# Patient Record
Sex: Female | Born: 1960 | Hispanic: No | Marital: Married | Smoking: Current every day smoker
Health system: Southern US, Community
[De-identification: ages and names within clinical notes are randomized; demographics above are authoritative.]

## PROBLEM LIST (undated history)

## (undated) DIAGNOSIS — J432 Centrilobular emphysema: Secondary | ICD-10-CM

## (undated) HISTORY — PX: FINGER AMPUTATION: SHX636

## (undated) HISTORY — DX: Centrilobular emphysema: J43.2

---

## 2010-08-29 ENCOUNTER — Ambulatory Visit
Admission: RE | Admit: 2010-08-29 | Discharge: 2010-08-29 | Payer: Self-pay | Source: Home / Self Care | Attending: Family Medicine | Admitting: Family Medicine

## 2010-08-29 DIAGNOSIS — J0100 Acute maxillary sinusitis, unspecified: Secondary | ICD-10-CM | POA: Insufficient documentation

## 2010-08-29 DIAGNOSIS — R05 Cough: Secondary | ICD-10-CM | POA: Insufficient documentation

## 2010-09-20 NOTE — Assessment & Plan Note (Signed)
Summary: NOV sinusitis   Vital Signs:  Patient profile:   50 year old female Height:      66 inches Weight:      134 pounds BMI:     21.71 O2 Sat:      97 % on Room air Temp:     98.2 degrees F oral Pulse rate:   98 / minute BP sitting:   92 / 63  (left arm) Cuff size:   regular  Vitals Entered By: Payton Spark CMA (August 29, 2010 2:14 PM)  O2 Flow:  Room air CC: New to est. Sinus inection x 3 weeks.    Primary Care Provider:  Seymour Bars DO  CC:  New to est. Sinus inection x 3 weeks. Bianca Lin  History of Present Illness: 50 yo WF presents with a head cold that began the wk before Xmas.  She had chest tightness.  She had a little sore throat.  She had chest tightness and SOB.  Started to taking OTC cold meds.  She had bodyaches and headache.  She has had a productive cough.  She has a lot of rhinorrhea.  She is getting ear pressure but no HAs.     She is o/w healthy but smokes.   Current Medications (verified): 1)  Valtrex 1 Gm Tabs (Valacyclovir Hcl) .... Take 1/2 Tab By Mouth Once Daily 2)  Vitamin E 3)  Citrucel  Powd (Methylcellulose (Laxative))  Allergies (verified): 1)  ! Sulfa  Past History:  Past Medical History: smoker gyn:Dr Minerva Areola  Past Surgical History: TAH w/o oophorectomy for scar tissue 2009 ovarian cyst 1996  Family History: mother alive, high chol father deceased, 17 - lung cancer sister healthy  Social History: SAHM.  Married to [place]. Has a 39 yo son in [place] and a 24 yo daughter at home. Smokes 1/2 ppd x 30 yrs. 5 beers/ wk. 5 coffees/ day. Walks the dog 30 min/ day.  Review of Systems       no fevers/sweats/weakness, unexplained wt loss/gain, no change in vision, no difficulty hearing, ringing in ears, no hay fever/+allergies, no CP/discomfort, no palpitations, no breast lump/nipple discharge, + cough/wheeze, no blood in stool, no N/V/D, no nocturia, no leaking urine, no unusual vag bleeding, no vaginal/penile discharge, no  muscle/joint pain, no rash, no new/changing mole, no HA, no memory loss, no anxiety, no sleep problem, no depression, no unexplained lumps, no easy bruising/bleeding, no concern with sexual function   Physical Exam  General:  alert, well-developed, well-nourished, and well-hydrated.   Head:  normocephalic and atraumatic.  maxillary sinuses TTP Eyes:  conjunctiva clear; wears glasses Ears:  EACs patent; TMs translucent and gray with good cone of light and bony landmarks.  Nose:  boggy turbinates with nasal congestion Mouth:  pharynx pink and moist and fair dentition.  cobblestoning Neck:  no masses.   Lungs:  Normal respiratory effort, chest expands symmetrically. Lungs are clear to auscultation, no crackles or wheezes. Heart:  Normal rate and regular rhythm. S1 and S2 normal without gallop, murmur, click, rub or other extra sounds. Extremities:  no LE edema Skin:  color normal.   Cervical Nodes:  No lymphadenopathy noted   Impression & Recommendations:  Problem # 1:  ACUTE MAXILLARY SINUSITIS (ICD-461.0) Will treat given long duration with Amoxicillin x 10 days + supportive care measures.   Call if not improved in 10 days. Her updated medication list for this problem includes:    Amoxicillin 875 Mg Tabs (Amoxicillin) .Bianca KitchenMarland KitchenMarland KitchenMarland Lin 1  tab by mouth q 12 hrs x 10 days  Problem # 2:  COUGH (ICD-786.2) Cough secondary to postnasal drip.  Treat with OTC Mucinex DM, avoiding smoking and a ProAir inhaler.  She may have some COPD given her smoking hx. Plan to do PFTs this year.  Complete Medication List: 1)  Valtrex 1 Gm Tabs (Valacyclovir hcl) .... Take 1/2 tab by mouth once daily 2)  Vitamin E  3)  Citrucel Powd (Methylcellulose (laxative)) 4)  Amoxicillin 875 Mg Tabs (Amoxicillin) .Bianca Lin.. 1 tab by mouth q 12 hrs x 10 days 5)  Proair Hfa 108 (90 Base) Mcg/act Aers (Albuterol sulfate) .... 2 puffs 3-4 x a day as needed for wheezing  Patient Instructions: 1)  For sinusitis: 2)  Take 10 days of  Amoxicillin. 3)  Avoid smoking. 4)  Use OTC Mucinex DM for cough and congestion with daily zyrtec. 5)  Use ProAir 2 puffs 3-4 x a day for the next 10 days for wheezing and shortness of breathe then move to as needed. 6)  Call if not improved after 10 days. 7)  REturn for a PHYSICAL w/o pap in 2 mos. Prescriptions: AMOXICILLIN 875 MG TABS (AMOXICILLIN) 1 tab by mouth q 12 hrs x 10 days  #20 x 0   Entered and Authorized by:   Seymour Bars DO   Signed by:   Seymour Bars DO on 08/29/2010   Method used:   Electronically to        [id number]* (retail)       [address], [place]  [address]       Ph: [phone]       Fax: [phone]   RxID:   [id number]    Orders Added: 1)  New Patient Level III [84132]

## 2010-10-01 ENCOUNTER — Ambulatory Visit: Payer: Self-pay | Admitting: Family Medicine

## 2012-06-16 ENCOUNTER — Ambulatory Visit (INDEPENDENT_AMBULATORY_CARE_PROVIDER_SITE_OTHER): Admitting: Sports Medicine

## 2012-06-16 VITALS — BP 98/62 | HR 87

## 2012-06-16 DIAGNOSIS — Z2989 Encounter for other specified prophylactic measures: Secondary | ICD-10-CM

## 2012-06-16 DIAGNOSIS — Z298 Encounter for other specified prophylactic measures: Secondary | ICD-10-CM

## 2012-06-16 DIAGNOSIS — Z23 Encounter for immunization: Secondary | ICD-10-CM

## 2012-06-16 NOTE — Progress Notes (Signed)
I was present for all essential parts of this procedure. Ihor Austin. Benjamin Stain, M.D.

## 2013-05-19 ENCOUNTER — Ambulatory Visit: Payer: PRIVATE HEALTH INSURANCE

## 2013-05-20 ENCOUNTER — Ambulatory Visit: Payer: PRIVATE HEALTH INSURANCE | Admitting: *Deleted

## 2013-05-27 ENCOUNTER — Ambulatory Visit (INDEPENDENT_AMBULATORY_CARE_PROVIDER_SITE_OTHER): Admitting: Sports Medicine

## 2013-05-27 DIAGNOSIS — Z23 Encounter for immunization: Secondary | ICD-10-CM

## 2013-05-27 NOTE — Progress Notes (Signed)
  Subjective:    Patient ID: Bianca Lin, female    DOB: [birth year], 52 y.o.   MRN: [id number] Flu shot given.  Right deltoid.  No complications. Donne Anon, CMA HPI    Review of Systems     Objective:   Physical Exam        Assessment & Plan:

## 2014-01-04 ENCOUNTER — Ambulatory Visit (INDEPENDENT_AMBULATORY_CARE_PROVIDER_SITE_OTHER): Admitting: Sports Medicine

## 2014-01-04 DIAGNOSIS — Z23 Encounter for immunization: Secondary | ICD-10-CM

## 2014-01-04 MED ORDER — PNEUMOCOCCAL VAC POLYVALENT 25 MCG/0.5ML IJ INJ: 0.5000 mL | INJECTION | Freq: Once | INTRAMUSCULAR | Status: DC

## 2014-01-04 NOTE — Progress Notes (Signed)
Pneumococcal vaccine as above.

## 2014-01-17 LAB — HM MAMMOGRAPHY

## 2014-06-02 ENCOUNTER — Ambulatory Visit (INDEPENDENT_AMBULATORY_CARE_PROVIDER_SITE_OTHER): Admitting: Sports Medicine

## 2014-06-02 DIAGNOSIS — Z23 Encounter for immunization: Secondary | ICD-10-CM

## 2014-06-02 NOTE — Progress Notes (Signed)
   Subjective:    Patient ID: Bianca Lin, female    DOB: 28-Jan-1961, 53 y.o.   MRN: [id number]  HPI   Pt is here for flu injection  Review of Systems     Objective:   Physical Exam        Assessment & Plan:

## 2014-09-05 ENCOUNTER — Encounter: Payer: Self-pay | Admitting: Sports Medicine

## 2014-09-05 ENCOUNTER — Ambulatory Visit (INDEPENDENT_AMBULATORY_CARE_PROVIDER_SITE_OTHER)

## 2014-09-05 ENCOUNTER — Ambulatory Visit (INDEPENDENT_AMBULATORY_CARE_PROVIDER_SITE_OTHER): Admitting: Sports Medicine

## 2014-09-05 VITALS — BP 105/64 | HR 77 | Ht 66.0 in | Wt 130.0 lb

## 2014-09-05 DIAGNOSIS — M5412 Radiculopathy, cervical region: Secondary | ICD-10-CM

## 2014-09-05 DIAGNOSIS — M791 Myalgia: Secondary | ICD-10-CM

## 2014-09-05 DIAGNOSIS — M7062 Trochanteric bursitis, left hip: Secondary | ICD-10-CM | POA: Diagnosis not present

## 2014-09-05 DIAGNOSIS — M7918 Myalgia, other site: Secondary | ICD-10-CM | POA: Insufficient documentation

## 2014-09-05 DIAGNOSIS — M542 Cervicalgia: Secondary | ICD-10-CM | POA: Diagnosis not present

## 2014-09-05 MED ORDER — VENLAFAXINE HCL ER 37.5 MG PO CP24: ORAL_CAPSULE | ORAL | Status: DC

## 2014-09-05 MED ORDER — VENLAFAXINE HCL ER 75 MG PO CP24: 75.0000 mg | ORAL_CAPSULE | Freq: Every day | ORAL | Status: DC

## 2014-09-05 NOTE — Progress Notes (Signed)
  Subjective:    CC: hip and back pain  HPI: This is a pleasant 54 year old female who comes in complaining of pain all over, neck, back, hips, shoulders.  Neck pain: Specifically comes down the left upper shoulder and left periscapular region. No numbness or tingling to the fingertips.  Left hip pain: Localized over the greater trochanter, moderate, persistent.  Pain all over: She does endorse depressive symptoms, she lost her husband a year ago.  Does have some depressed mood. No suicidal or homicidal ideation  Past medical history, Surgical history, Family history not pertinant except as noted below, Social history, Allergies, and medications have been entered into the medical record, reviewed, and no changes needed.   Review of Systems: No fevers, chills, night sweats, weight loss, chest pain, or shortness of breath.   Objective:    General: Well Developed, well nourished, and in no acute distress.  Neuro: Alert and oriented x3, extra-ocular muscles intact, sensation grossly intact.  HEENT: Normocephalic, atraumatic, pupils equal round reactive to light, neck supple, no masses, no lymphadenopathy, thyroid nonpalpable.  Skin: Warm and dry, no rashes. Cardiac: Regular rate and rhythm, no murmurs rubs or gallops, no lower extremity edema.  Respiratory: Clear to auscultation bilaterally. Not using accessory muscles, speaking in full sentences. Neck: Negative spurling's Full neck range of motion Grip strength and sensation normal in bilateral hands Strength good C4 to T1 distribution No sensory change to C4 to T1 Reflexes normal Left Hip: ROM IR: 60 Deg, ER: 60 Deg, Flexion: 120 Deg, Extension: 100 Deg, Abduction: 45 Deg, Adduction: 45 Deg Strength IR: 5/5, ER: 5/5, Flexion: 5/5, Extension: 5/5, Abduction: 5/5, Adduction: 5/5 Pelvic alignment unremarkable to inspection and palpation. Standing hip rotation and gait without trendelenburg / unsteadiness. Greater trochanter with  significant tenderness to palpation. No tenderness over piriformis. No SI joint tenderness and normal minimal SI movement. Hip abductor significantly weak  Impression and Recommendations:

## 2014-09-05 NOTE — Assessment & Plan Note (Signed)
X-rays, formal physical therapy. Return in 4 weeks.

## 2014-09-05 NOTE — Assessment & Plan Note (Signed)
There is no element of myofascial pain syndrome, likely fibromyalgia with associated depressed mood with widespread myalgias. Starting Effexor. She did lose her husband last year.

## 2014-09-05 NOTE — Assessment & Plan Note (Signed)
Starting with formal PT. Return in one month, injection no better. I would also like to see her in the interim custom orthotics. She did start a new job at [employer], and is on her feet all day.

## 2014-09-09 ENCOUNTER — Encounter: Admitting: Sports Medicine

## 2014-09-19 ENCOUNTER — Encounter: Admitting: Sports Medicine

## 2014-09-20 ENCOUNTER — Ambulatory Visit (INDEPENDENT_AMBULATORY_CARE_PROVIDER_SITE_OTHER): Admitting: Physical Therapy

## 2014-09-20 DIAGNOSIS — R293 Abnormal posture: Secondary | ICD-10-CM

## 2014-09-20 DIAGNOSIS — M2560 Stiffness of unspecified joint, not elsewhere classified: Secondary | ICD-10-CM

## 2014-09-20 DIAGNOSIS — M2550 Pain in unspecified joint: Secondary | ICD-10-CM | POA: Diagnosis not present

## 2014-09-20 DIAGNOSIS — M5412 Radiculopathy, cervical region: Secondary | ICD-10-CM

## 2014-09-22 ENCOUNTER — Encounter: Payer: Self-pay | Admitting: Sports Medicine

## 2014-09-22 ENCOUNTER — Ambulatory Visit (INDEPENDENT_AMBULATORY_CARE_PROVIDER_SITE_OTHER): Admitting: Sports Medicine

## 2014-09-22 VITALS — BP 103/60 | HR 74 | Ht 66.0 in | Wt 130.0 lb

## 2014-09-22 DIAGNOSIS — M7062 Trochanteric bursitis, left hip: Secondary | ICD-10-CM

## 2014-09-22 NOTE — Assessment & Plan Note (Signed)
Custom orthotics as above. 

## 2014-09-22 NOTE — Progress Notes (Signed)

## 2014-09-27 ENCOUNTER — Encounter (INDEPENDENT_AMBULATORY_CARE_PROVIDER_SITE_OTHER): Admitting: Physical Therapy

## 2014-09-27 DIAGNOSIS — M5412 Radiculopathy, cervical region: Secondary | ICD-10-CM | POA: Diagnosis not present

## 2014-09-27 DIAGNOSIS — M2550 Pain in unspecified joint: Secondary | ICD-10-CM | POA: Diagnosis not present

## 2014-09-27 DIAGNOSIS — M2560 Stiffness of unspecified joint, not elsewhere classified: Secondary | ICD-10-CM | POA: Diagnosis not present

## 2014-09-27 DIAGNOSIS — R293 Abnormal posture: Secondary | ICD-10-CM

## 2014-10-03 ENCOUNTER — Ambulatory Visit: Admitting: Sports Medicine

## 2014-10-04 ENCOUNTER — Encounter: Admitting: Physical Therapy

## 2014-10-18 ENCOUNTER — Ambulatory Visit (INDEPENDENT_AMBULATORY_CARE_PROVIDER_SITE_OTHER)

## 2014-10-18 ENCOUNTER — Telehealth: Payer: Self-pay

## 2014-10-18 ENCOUNTER — Ambulatory Visit (INDEPENDENT_AMBULATORY_CARE_PROVIDER_SITE_OTHER): Admitting: Sports Medicine

## 2014-10-18 ENCOUNTER — Encounter: Payer: Self-pay | Admitting: Sports Medicine

## 2014-10-18 VITALS — BP 117/75 | HR 99 | Ht 66.0 in | Wt 130.0 lb

## 2014-10-18 DIAGNOSIS — M7918 Myalgia, other site: Secondary | ICD-10-CM

## 2014-10-18 DIAGNOSIS — M5412 Radiculopathy, cervical region: Secondary | ICD-10-CM | POA: Diagnosis not present

## 2014-10-18 DIAGNOSIS — M791 Myalgia: Secondary | ICD-10-CM

## 2014-10-18 DIAGNOSIS — Z0000 Encounter for general adult medical examination without abnormal findings: Secondary | ICD-10-CM

## 2014-10-18 DIAGNOSIS — J432 Centrilobular emphysema: Secondary | ICD-10-CM

## 2014-10-18 HISTORY — DX: Centrilobular emphysema: J43.2

## 2014-10-18 MED ORDER — ACETAMINOPHEN ER 650 MG PO TBCR: EXTENDED_RELEASE_TABLET | ORAL | Status: DC

## 2014-10-18 NOTE — Telephone Encounter (Signed)
Patient has been advised. Rhonda Cunningham,CMA  

## 2014-10-18 NOTE — Telephone Encounter (Signed)
I would really need to see her to determine whether she has a sinus infection versus simple rhinitis.  I would recommend over-the-counter Flonase 1 spray each nostril twice a day first. She told me that she would like to avoid medications if at all possible.

## 2014-10-18 NOTE — Assessment & Plan Note (Signed)
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  she has done a bit of physical therapy, we discussed traction today. She does desire to proceed with nonpharmacologic measures.  she has not taken the Effexor.  She may increase to arthritis strength Tylenol.

## 2014-10-18 NOTE — Assessment & Plan Note (Signed)
Did not take the Effexor. We discussed the monoamine hypothesis of depression and fascial pain, she will think about it.

## 2014-10-18 NOTE — Progress Notes (Signed)
  Subjective:    CC: Follow-up  HPI: Myofascial pain: Decided not to take Effexor. Does continue to endorse some depressed mood.  Neck pain: Did a few sessions of therapy and has improved somewhat, continues to desire a nonpharmacologic approach.  Annual physical exam: Up-to-date on mammogram, has had a hysterectomy for fibroids, is a 30-pack-year smoker, also due for a colonoscopy.  Past medical history, Surgical history, Family history not pertinant except as noted below, Social history, Allergies, and medications have been entered into the medical record, reviewed, and no changes needed.   Review of Systems: No fevers, chills, night sweats, weight loss, chest pain, or shortness of breath.   Objective:    General: Well Developed, well nourished, and in no acute distress.  Neuro: Alert and oriented x3, extra-ocular muscles intact, sensation grossly intact.  HEENT: Normocephalic, atraumatic, pupils equal round reactive to light, neck supple, no masses, no lymphadenopathy, thyroid nonpalpable.  Skin: Warm and dry, no rashes. Cardiac: Regular rate and rhythm, no murmurs rubs or gallops, no lower extremity edema.  Respiratory: Clear to auscultation bilaterally. Not using accessory muscles, speaking in full sentences.  Impression and Recommendations:

## 2014-10-18 NOTE — Assessment & Plan Note (Signed)
Up-to-date on mammogram, post hysterectomy so does not need any Pap smears. She is a smoker and will need a screening CT.  due for a colonoscopy

## 2014-10-18 NOTE — Assessment & Plan Note (Signed)
Incidentally noted on screening chest CT, this likely represents COPD. If she gets respiratory complaints we will treat aggressively. Smoking cessation advised.

## 2014-10-18 NOTE — Telephone Encounter (Signed)
Patient called stated that when she was in the office today you all discussed her sinus infection she stated that it got off track and never asked if she could get an antibiotic sent in to her pharmacy. Patient is requsting something be sent to [employer] [facility]. Rhonda Cunningham,CMA

## 2014-10-24 ENCOUNTER — Encounter: Payer: Self-pay | Admitting: Physical Therapy

## 2014-10-24 ENCOUNTER — Ambulatory Visit (INDEPENDENT_AMBULATORY_CARE_PROVIDER_SITE_OTHER): Admitting: Physical Therapy

## 2014-10-24 DIAGNOSIS — M2550 Pain in unspecified joint: Secondary | ICD-10-CM

## 2014-10-24 DIAGNOSIS — M6281 Muscle weakness (generalized): Secondary | ICD-10-CM

## 2014-10-24 DIAGNOSIS — R293 Abnormal posture: Secondary | ICD-10-CM

## 2014-10-24 DIAGNOSIS — M2560 Stiffness of unspecified joint, not elsewhere classified: Secondary | ICD-10-CM

## 2014-10-24 NOTE — Therapy (Addendum)
[place], [place], [address] Phone: [phone]   Fax:  [phone]  Physical Therapy Treatment  Patient Details  Name: Bianca Lin MRN: [id number] Date of Birth: Jan 03, 1961 Referring Provider:  Silverio Decamp,*  Encounter Date: 10/24/2014      PT End of Session - 10/24/14 1202    Visit Number 3   Number of Visits 8   Date for PT Re-Evaluation 11/18/14   PT Start Time 1155   PT Stop Time 1231   PT Time Calculation (min) 36 min   Activity Tolerance Patient limited by fatigue      History reviewed. No pertinent past medical history.  History reviewed. No pertinent past surgical history.  There were no vitals taken for this visit.  Visit Diagnosis:  Muscle weakness-general - Plan: PT plan of care cert/re-cert  Stiffness of multiple joints - Plan: PT plan of care cert/re-cert  Pain, joint, multiple sites - Plan: PT plan of care cert/re-cert  Abnormal posture - Plan: PT plan of care cert/re-cert      Subjective Assessment - 10/24/14 1203    Symptoms Feels like hip and neck pain are a little better. Patient reports she has been very sick and not able to attend therapy.  However is performing EHP   Currently in Pain? Yes   Pain Score 4    Pain Location Neck   Pain Orientation Left   Pain Descriptors / Indicators Nagging   Pain Type Chronic pain   Pain Onset More than a month ago   Pain Frequency Constant   Aggravating Factors  fatigue through the day   Pain Relieving Factors No hip pain today, neck feels better first thing in AM          [facility] PT Assessment - 10/24/14 0001    Assessment   Medical Diagnosis Lt cervical radiculopathy and Lt hip bursitis   Onset Date 07/25/14   Prior Function   Leisure wishes to be able to perform overhead work with out increased pain, rise for sitting without Lt hip pain   Observation/Other Assessments   Focus on Therapeutic Outcomes  (FOTO)  44% limited   ROM / Strength   AROM / PROM / Strength AROM;Strength   AROM   Overall AROM Comments hamstring flexibility Rt 95 degrees, Lt 85 degrees.    AROM Assessment Site Hip;Cervical   Cervical - Right Rotation 53   Cervical - Left Rotation 48   Strength   Strength Assessment Site Hip   Right/Left Hip Right;Left   Right Hip ABduction 5/5   Left Hip ABduction --  5-/5                  [facility] Adult PT Treatment/Exercise - 10/24/14 0001    Exercises   Exercises Neck;Knee/Hip   Neck Exercises: Seated   Cervical Isometrics Right rotation;Left rotation;5 secs;10 reps   Cervical Isometrics Limitations light push   Shoulder Shrugs 20 reps   Other Seated Exercise lateral neck flexion stretch x 20 sec bilat.    Neck Exercises: Supine   Shoulder ABduction 20 reps;Both  red band   Upper Extremity D1 20 reps;Theraband;Flexion   Theraband Level (UE D1) Level 2 (Red)   Knee/Hip Exercises: Aerobic   Stationary Bike nustep L4 x5'   Knee/Hip Exercises: Supine   Bridges 3 sets;10 reps;Strengthening   Other Supine Knee Exercises hooklying knees in/out with blue band 3x10  PT Education - 10/24/14 1238    Education provided Yes   Education Details posture   Person(s) Educated Patient   Methods Explanation   Comprehension Verbalized understanding             PT Long Term Goals - 10/24/14 1239    PT LONG TERM GOAL #1   Title demonstrate and /or verbalize techniques to reduce the risk of re-injury to include info on posture ad body mechanics   Time 4   Period Weeks   Status On-going   PT LONG TERM GOAL #2   Title I with advanced HEP   Time 4   Period Weeks   Status On-going   PT LONG TERM GOAL #3   Title report pain in her neck /shoulder to decrease =/> 50 with work   Baseline reports 10% improvement   Time 4   Period Weeks   Status On-going   PT LONG TERM GOAL #4   Title report pain in her Lt hip to decrease =/> 75% to allow her  to sleep on her Lt side.    Baseline 50% reduction, is able to sleep on Lt side now   Time 4   Period Weeks   Status Partially Met   PT LONG TERM GOAL #5   Title improve FOTO =/< 28% limited   Baseline scored 44% limited   Time 4   Period Weeks   Status On-going   Additional Long Term Goals   Additional Long Term Goals Yes   PT LONG TERM GOAL #6   Title increase ROM Lt cervical rotation =/> 60 degrees.    Time 4   Period Weeks   Status On-going   PT LONG TERM GOAL #7   Title increase Lt hip strength =/> 5-/5    Status Achieved   PT LONG TERM GOAL #8   Title perform overhead work without increased pain   Time 4   Period Weeks   Status New   PT LONG TERM GOAL  #9   TITLE decrease pain in Lt hip =/> 50% with transitioning sit to stand after prolonged sitting   Time 4   Period Weeks   Status New               Plan - 10/24/14 1225    Clinical Impression Statement Patient has missed most of her POC due to illness, she has had some improvement with performing HEP, hip more so than neck.  She would like to continue with PT to further decrease pain and restore function   Pt will benefit from skilled therapeutic intervention in order to improve on the following deficits Decreased range of motion;Difficulty walking;Postural dysfunction;Pain;Impaired UE functional use;Decreased strength   Rehab Potential Good   PT Frequency 1x / week   PT Duration 4 weeks   PT Treatment/Interventions Moist Heat;Therapeutic activities;Patient/family education;Passive range of motion;Therapeutic exercise;Ultrasound;Balance training;Manual techniques;Stair training;Neuromuscular re-education;Cryotherapy   PT Next Visit Plan porgress HEP for cervical stability and hip strengthening in standing   Consulted and Agree with Plan of Care Patient        Problem List Patient Active Problem List   Diagnosis Date Noted  . Annual physical exam 10/18/2014  . Centrilobular emphysema 10/18/2014  . Left  cervical radiculopathy 09/05/2014  . Trochanteric bursitis of left hip 09/05/2014  . Myofascial pain 09/05/2014    Jeral Pinch, PT 10/24/2014, 12:47 PM  [facility] [place], [place], [address]  Phone: [phone]   Fax:  [phone]     PHYSICAL THERAPY DISCHARGE SUMMARY  Visits from Start of Care: 1  Current functional level related to goals / functional outcomes: Unable to assess as patient did not return for f/u visits.   Remaining deficits: unknown   Education / Equipment: hep  Plan: Patient agrees to discharge.  Patient goals were not met. Patient is being discharged due to not returning since the last visit.  ?????       Madelyn Flavors, PT 12/02/2014 12:38 PM  [facility] [place],  [address]  [phone] (office) [phone] (fax)

## 2014-10-24 NOTE — Patient Instructions (Signed)
Continue with same HEP for now, work on upright posture/positioning

## 2014-11-01 ENCOUNTER — Encounter: Admitting: Physical Therapy

## 2014-11-16 ENCOUNTER — Ambulatory Visit (INDEPENDENT_AMBULATORY_CARE_PROVIDER_SITE_OTHER): Admitting: Sports Medicine

## 2014-11-16 ENCOUNTER — Encounter: Payer: Self-pay | Admitting: Sports Medicine

## 2014-11-16 DIAGNOSIS — M5412 Radiculopathy, cervical region: Secondary | ICD-10-CM | POA: Diagnosis not present

## 2014-11-16 MED ORDER — DICLOFENAC SODIUM 2 % TD SOLN: 2.0000 | Freq: Two times a day (BID) | TRANSDERMAL | Status: DC

## 2014-11-16 MED ORDER — MELOXICAM 15 MG PO TABS: ORAL_TABLET | ORAL | Status: DC

## 2014-11-16 NOTE — Progress Notes (Signed)
  Subjective:    CC: Follow-up  HPI: This is a pleasant 54 year old female, she had neck pain for some time with left radiculopathy, and degenerative changes on x-ray, she has been very resistant to using any form of oral medication, she is amenable to try meloxicam today, she has not done any rehabilitation. Symptoms are moderate, persistent. No bowel or bladder dysfunction or saddle numbness.  Smoker: Is cutting down her cigarette use, she did have emphysema visualized on CT scan.  Myofascial pain/depression: Has not, and at this point does not want to take Effexor  Past medical history, Surgical history, Family history not pertinant except as noted below, Social history, Allergies, and medications have been entered into the medical record, reviewed, and no changes needed.   Review of Systems: No fevers, chills, night sweats, weight loss, chest pain, or shortness of breath.   Objective:    General: Well Developed, well nourished, and in no acute distress.  Neuro: Alert and oriented x3, extra-ocular muscles intact, sensation grossly intact.  HEENT: Normocephalic, atraumatic, pupils equal round reactive to light, neck supple, no masses, no lymphadenopathy, thyroid nonpalpable.  Skin: Warm and dry, no rashes. Cardiac: Regular rate and rhythm, no murmurs rubs or gallops, no lower extremity edema.  Respiratory: Clear to auscultation bilaterally. Not using accessory muscles, speaking in full sentences.  Impression and Recommendations:

## 2014-11-16 NOTE — Assessment & Plan Note (Signed)
Slightly improved, she is amenable to trying meloxicam so she will start this as well as topical Pennsaid Home exercises given. If no better in one month we will order an MRI for interventional planning.

## 2014-12-13 ENCOUNTER — Encounter: Payer: Self-pay | Admitting: Sports Medicine

## 2014-12-13 ENCOUNTER — Ambulatory Visit (INDEPENDENT_AMBULATORY_CARE_PROVIDER_SITE_OTHER): Admitting: Sports Medicine

## 2014-12-13 VITALS — BP 123/71 | HR 78 | Ht 66.0 in | Wt 131.0 lb

## 2014-12-13 DIAGNOSIS — M5412 Radiculopathy, cervical region: Secondary | ICD-10-CM

## 2014-12-13 DIAGNOSIS — Z0000 Encounter for general adult medical examination without abnormal findings: Secondary | ICD-10-CM

## 2014-12-13 DIAGNOSIS — M7062 Trochanteric bursitis, left hip: Secondary | ICD-10-CM | POA: Diagnosis not present

## 2014-12-13 DIAGNOSIS — M7918 Myalgia, other site: Secondary | ICD-10-CM

## 2014-12-13 DIAGNOSIS — M791 Myalgia: Secondary | ICD-10-CM

## 2014-12-13 MED ORDER — ACETAMINOPHEN ER 650 MG PO TBCR: 650.0000 mg | EXTENDED_RELEASE_TABLET | Freq: Three times a day (TID) | ORAL | Status: AC | PRN

## 2014-12-13 NOTE — Assessment & Plan Note (Signed)
Referral placed for colonoscopy. 

## 2014-12-13 NOTE — Progress Notes (Signed)
  Subjective:    CC: Follow-up  HPI: Myofascial pain: This is a pleasant 54 year old female, she has been very resistant to any form of pharmacologic intervention, she did have some cervical radiculopathy as well as trochanteric bursitis. At the last visit for she finally agreed to try meloxicam, and has done extremely well. Pain is mild, improving. She is also amenable to try some Tylenol now as well. She declined any form of a psychotherapeutic.  Preventive measures: Due for colonoscopy.  Past medical history, Surgical history, Family history not pertinant except as noted below, Social history, Allergies, and medications have been entered into the medical record, reviewed, and no changes needed.   Review of Systems: No fevers, chills, night sweats, weight loss, chest pain, or shortness of breath.   Objective:    General: Well Developed, well nourished, and in no acute distress.  Neuro: Alert and oriented x3, extra-ocular muscles intact, sensation grossly intact.  HEENT: Normocephalic, atraumatic, pupils equal round reactive to light, neck supple, no masses, no lymphadenopathy, thyroid nonpalpable.  Skin: Warm and dry, no rashes. Cardiac: Regular rate and rhythm, no murmurs rubs or gallops, no lower extremity edema.  Respiratory: Clear to auscultation bilaterally. Not using accessory muscles, speaking in full sentences. Neck: Negative spurling's Full neck range of motion Grip strength and sensation normal in bilateral hands Strength good C4 to T1 distribution No sensory change to C4 to T1 Reflexes normal Left Hip: ROM IR: 60 Deg, ER: 60 Deg, Flexion: 120 Deg, Extension: 100 Deg, Abduction: 45 Deg, Adduction: 45 Deg Strength IR: 5/5, ER: 5/5, Flexion: 5/5, Extension: 5/5, Abduction: 5/5, Adduction: 5/5 Pelvic alignment unremarkable to inspection and palpation. Standing hip rotation and gait without trendelenburg / unsteadiness. Greater trochanter without tenderness to  palpation. No tenderness over piriformis. No SI joint tenderness and normal minimal SI movement.  Impression and Recommendations:

## 2014-12-13 NOTE — Assessment & Plan Note (Signed)
Now that we were finally able to get her to take an NSAID, she has improved significantly. Adding arthritis strength Tylenol, and we will continue topical pain said. 

## 2014-12-13 NOTE — Assessment & Plan Note (Signed)
Now that we were finally able to get her to take an NSAID, she has improved significantly. Adding arthritis strength Tylenol, and we will continue topical pain said.

## 2015-03-14 ENCOUNTER — Ambulatory Visit: Admitting: Sports Medicine

## 2015-03-14 ENCOUNTER — Encounter: Payer: Self-pay | Admitting: *Deleted

## 2015-04-19 ENCOUNTER — Ambulatory Visit (INDEPENDENT_AMBULATORY_CARE_PROVIDER_SITE_OTHER): Admitting: Sports Medicine

## 2015-04-19 ENCOUNTER — Encounter: Payer: Self-pay | Admitting: Sports Medicine

## 2015-04-19 VITALS — BP 106/67 | HR 79 | Wt 132.0 lb

## 2015-04-19 DIAGNOSIS — J340 Abscess, furuncle and carbuncle of nose: Secondary | ICD-10-CM

## 2015-04-19 MED ORDER — MUPIROCIN CALCIUM 2 % NA OINT
1.0000 | TOPICAL_OINTMENT | Freq: Two times a day (BID) | NASAL | Status: DC
Start: 2015-04-19 — End: 2016-02-27

## 2015-04-19 MED ORDER — DOXYCYCLINE HYCLATE 100 MG PO TABS: 100.0000 mg | ORAL_TABLET | Freq: Two times a day (BID) | ORAL | Status: AC

## 2015-04-19 NOTE — Assessment & Plan Note (Signed)
There is a small, healing abscess at the junction of the left nasal septum in the left medial nare. Oral doxycycline for 7 days and topical mupirocin. Next line return in 2 weeks just for a final recheck before considering ENT referral to consider I&D.

## 2015-04-19 NOTE — Progress Notes (Signed)
  Subjective:    ZO:XWRUE ulcer  HPI: For the past several months this pleasant 54 year old female has had on and off ulcers on the inside of her left nare.  These are painful, last for several days and then go away. She has a recurrence today and wonders what to do to prevent this from coming back.  Past medical history, Surgical history, Family history not pertinant except as noted below, Social history, Allergies, and medications have been entered into the medical record, reviewed, and no changes needed.   Review of Systems: No fevers, chills, night sweats, weight loss, chest pain, or shortness of breath.   Objective:    General: Well Developed, well nourished, and in no acute distress.  Neuro: Alert and oriented x3, extra-ocular muscles intact, sensation grossly intact.  HEENT: Normocephalic, atraumatic, pupils equal round reactive to light, neck supple, no masses, no lymphadenopathy, thyroid nonpalpable. Oropharynx, ear canals unremarkable, the left near shows a healing ulcer at the junction of the septum and the medial nare. Skin: Warm and dry, no rashes. Cardiac: Regular rate and rhythm, no murmurs rubs or gallops, no lower extremity edema.  Respiratory: Clear to auscultation bilaterally. Not using accessory muscles, speaking in full sentences.  Impression and Recommendations:

## 2015-05-03 ENCOUNTER — Encounter: Payer: Self-pay | Admitting: Sports Medicine

## 2015-05-03 ENCOUNTER — Ambulatory Visit (INDEPENDENT_AMBULATORY_CARE_PROVIDER_SITE_OTHER): Admitting: Sports Medicine

## 2015-05-03 VITALS — BP 103/62 | HR 69 | Wt 133.0 lb

## 2015-05-03 DIAGNOSIS — J340 Abscess, furuncle and carbuncle of nose: Secondary | ICD-10-CM

## 2015-05-03 DIAGNOSIS — Z23 Encounter for immunization: Secondary | ICD-10-CM | POA: Diagnosis not present

## 2015-05-03 NOTE — Progress Notes (Signed)
  Subjective:    CC: Follow-up  HPI: Nasal septal abscess: Essentially resolved now with doxycycline and topical mupirocin, she still has a bit of an abnormal sensation in the upper left nasal nare.  Past medical history, Surgical history, Family history not pertinant except as noted below, Social history, Allergies, and medications have been entered into the medical record, reviewed, and no changes needed.   Review of Systems: No fevers, chills, night sweats, weight loss, chest pain, or shortness of breath.   Objective:    General: Well Developed, well nourished, and in no acute distress.  Neuro: Alert and oriented x3, extra-ocular muscles intact, sensation grossly intact.  HEENT: Normocephalic, atraumatic, pupils equal round reactive to light, neck supple, no masses, no lymphadenopathy, thyroid nonpalpable.  Skin: Warm and dry, no rashes. Cardiac: Regular rate and rhythm, no murmurs rubs or gallops, no lower extremity edema.  Respiratory: Clear to auscultation bilaterally. Not using accessory muscles, speaking in full sentences.  Impression and Recommendations:

## 2015-05-03 NOTE — Assessment & Plan Note (Signed)
Resolved, continue nasal mupirocin for a couple more weeks.

## 2015-06-30 IMAGING — CT CT CHEST LUNG CANCER SCREENING LOW DOSE W/O CM
4 of 7 series · 16 of 39 positions shown, 18 images · non-contrast
Comparison: No priors.

CLINICAL DATA: 54-year-old female current smoker with 40 pack-year
history of smoking. Lung cancer screening examination.

EXAM:
CT CHEST WITHOUT CONTRAST
TECHNIQUE: Multidetector CT imaging of the chest was performed following the
standard protocol without IV contrast..

[Series 3: lung windows · axial · 0.60mm/px · z∈[-287,-83]mm · 5 of 273 slices shown, 7 images]
[im 55/273  mediastinal]
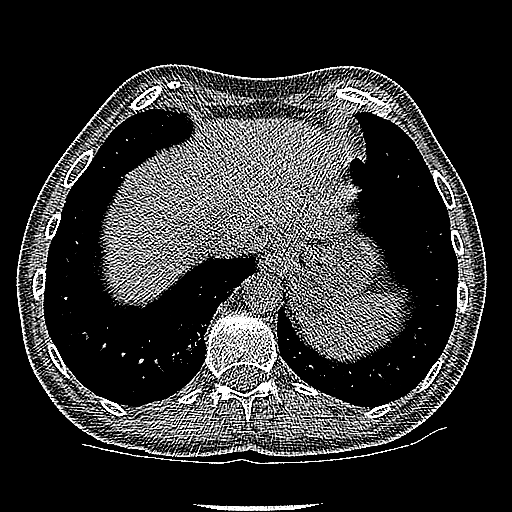
[im 55/273  lung]
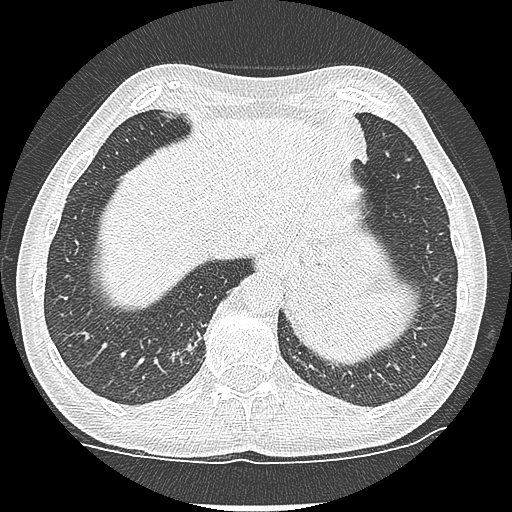
[im 109/273  lung]
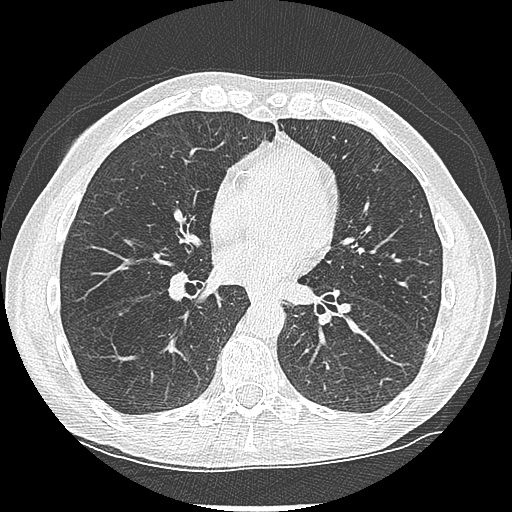
[im 131/273  lung]
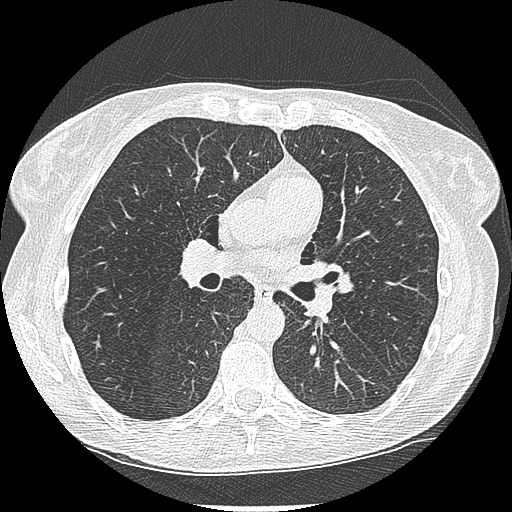
[im 164/273  lung]
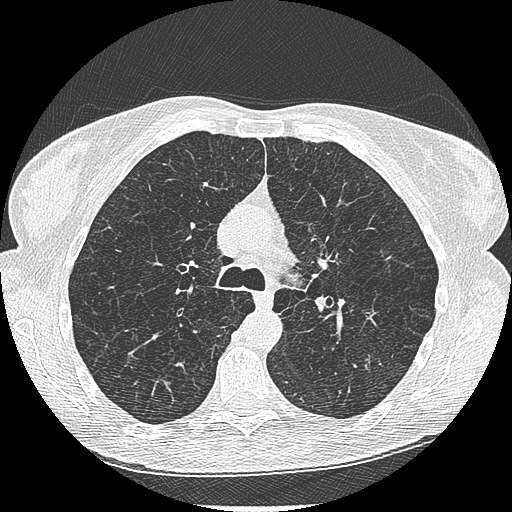
[im 218/273  mediastinal]
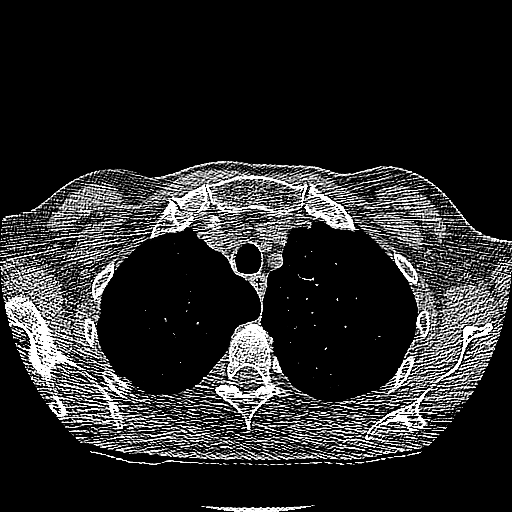
[im 218/273  lung]
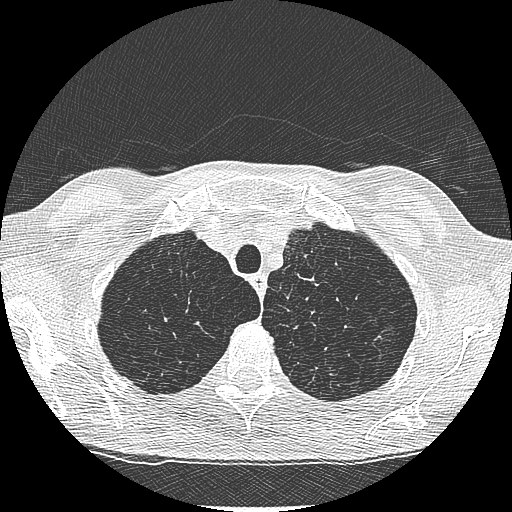

[Series 400: sag · sagittal · 0.68mm/px · 2 of 151 slices shown (1 of 2)]
[im 51/151  lung]
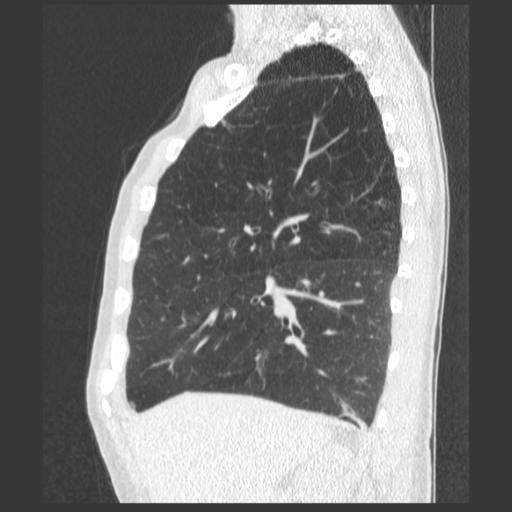
[im 101/151  lung]
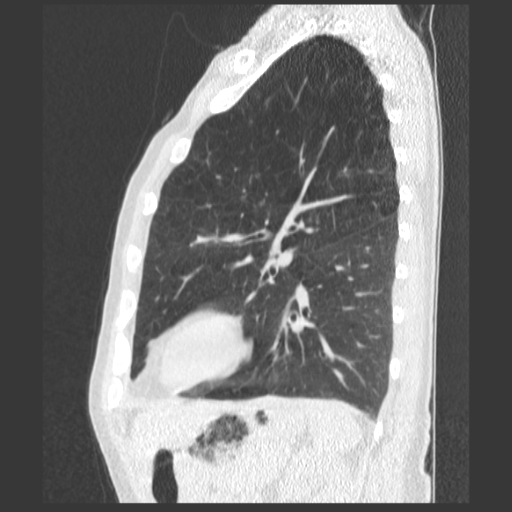

[Series 402: sag · sagittal · 0.68mm/px · 5 of 301 slices shown (2 of 2)]
[im 51/301  lung]
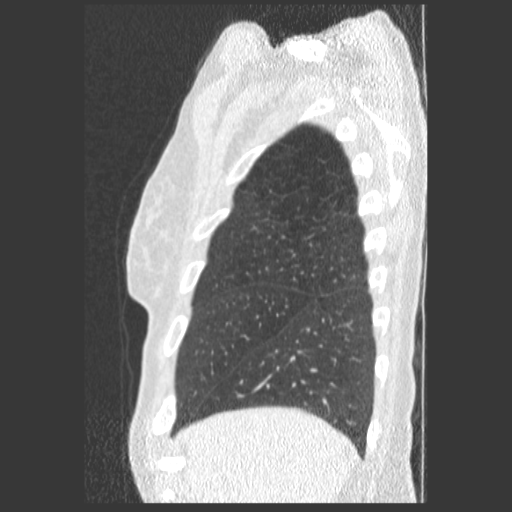
[im 101/301  lung]
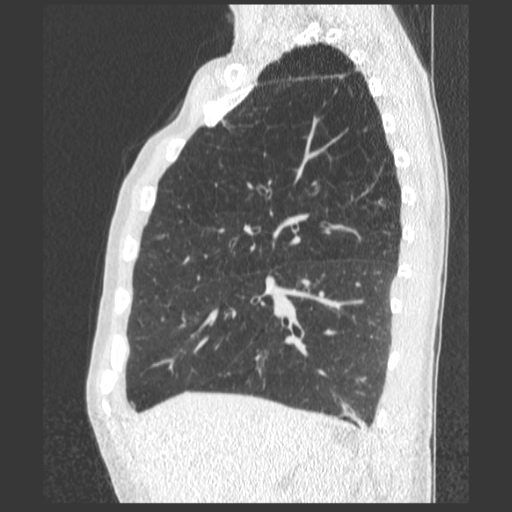
[im 151/301  lung]
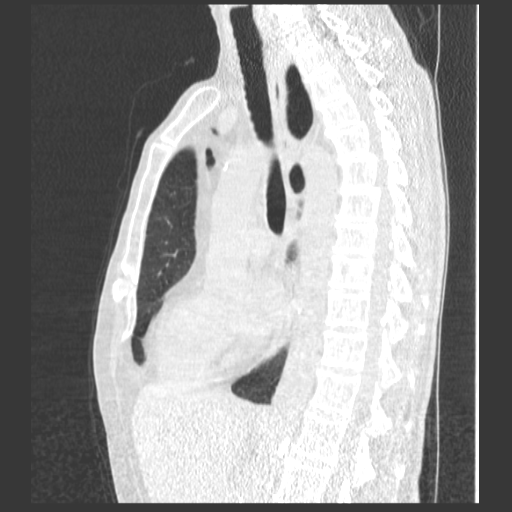
[im 201/301  lung]
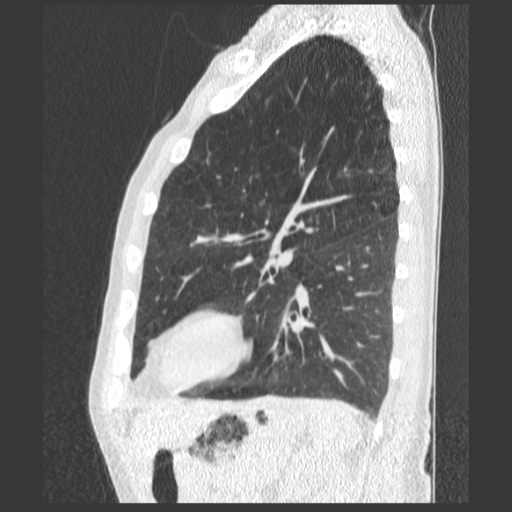
[im 251/301  lung]
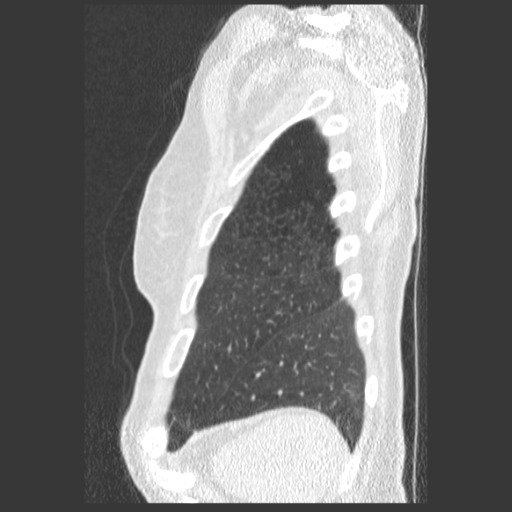

[Series 403: cor · coronal · 0.68mm/px · 4 of 260 slices shown]
[im 52/260  lung]
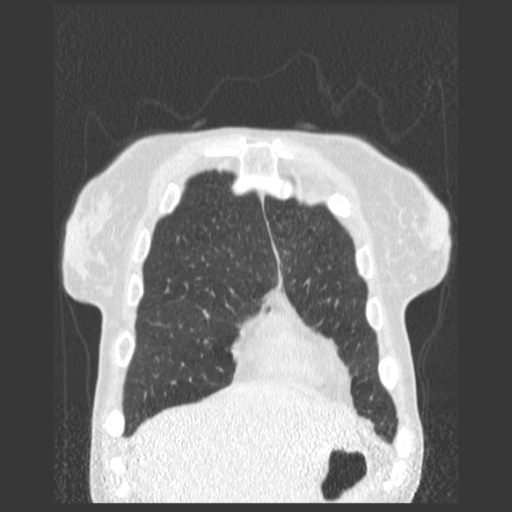
[im 104/260  lung]
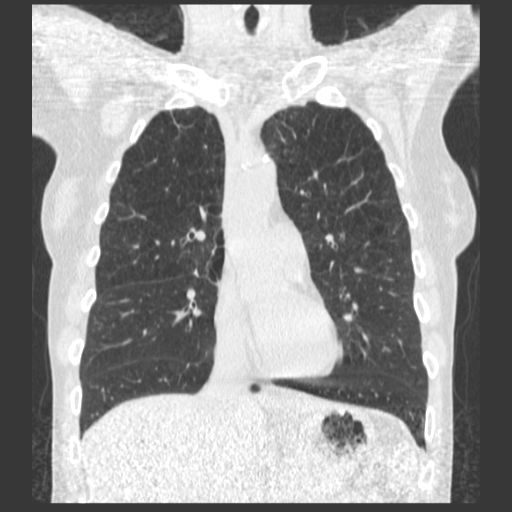
[im 156/260  lung]
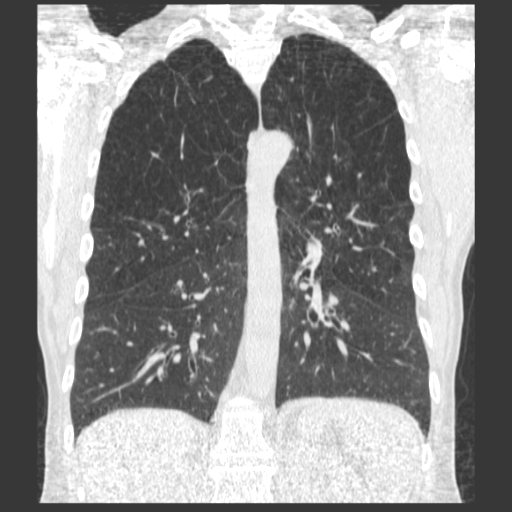
[im 208/260  lung]
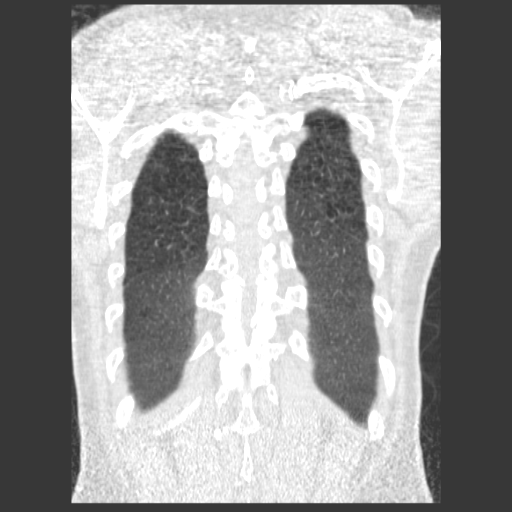

[16 of 39 positions shown; findings below may reference images not displayed]

FINDINGS: Mediastinum/Lymph Nodes: Heart size is normal. There is no
significant pericardial fluid, thickening or pericardial
calcification. There is atherosclerosis of the thoracic aorta, the
great vessels of the mediastinum and the coronary arteries,
including calcified atherosclerotic plaque in the right coronary
artery. No pathologically enlarged mediastinal or hilar lymph nodes.
Please note that accurate exclusion of hilar adenopathy is limited
on noncontrast CT scans. Esophagus is unremarkable in appearance.

Lungs/Pleura: In the posteromedial aspect of the right lower lobe
(image 224 of series 3) there is a nodular appearing area of
ground-glass attenuation with a volume derived mean diameter of
mm, which is favored to represent an area of post infectious or
inflammatory scarring. Similar findings are present to a lesser
extent in the posteromedial aspect of the left lower lobe as well.
No other suspicious appearing pulmonary nodules or masses are noted.
Bilateral apical pleuroparenchymal thickening and architectural
distortion, compatible with post infectious or inflammatory
scarring. Diffuse bronchial wall thickening with moderate
centrilobular and paraseptal emphysema, most severe in the lung
apices. No acute consolidative airspace disease. No pleural
effusions.

Upper Abdomen: Unremarkable.

Musculoskeletal/Soft Tissues: There are no aggressive appearing
lytic or blastic lesions noted in the visualized portions of the
skeleton.
IMPRESSION: 1. Lung-RADS Category 2S, benign appearance or behavior. Continue
annual screening with low-dose chest CT without contrast in 12
months.
2. The "S" modifier above refers to potentially clinically
significant non lung cancer related findings. Specifically, there is
atherosclerosis, including right coronary artery disease. Please
note that although the presence of coronary artery calcium documents
the presence of coronary artery disease, the severity of this
disease and any potential stenosis cannot be assessed on this
non-gated CT examination. Assessment for potential risk factor
modification, dietary therapy or pharmacologic therapy may be
warranted, if clinically indicated.
3. In addition, there is diffuse bronchial wall thickening with
moderate centrilobular and paraseptal emphysema; imaging findings
suggestive of underlying COPD.

## 2015-08-16 ENCOUNTER — Ambulatory Visit (INDEPENDENT_AMBULATORY_CARE_PROVIDER_SITE_OTHER): Admitting: Sports Medicine

## 2015-08-16 ENCOUNTER — Encounter: Payer: Self-pay | Admitting: Sports Medicine

## 2015-08-16 DIAGNOSIS — M5412 Radiculopathy, cervical region: Secondary | ICD-10-CM

## 2015-08-16 DIAGNOSIS — Z0000 Encounter for general adult medical examination without abnormal findings: Secondary | ICD-10-CM

## 2015-08-16 MED ORDER — ETODOLAC ER 500 MG PO TB24: 500.0000 mg | ORAL_TABLET | Freq: Every day | ORAL | Status: DC

## 2015-08-16 NOTE — Assessment & Plan Note (Signed)
Checking routine blood work, due for mammogram and colon cancer screening, we will use Cologuard.

## 2015-08-16 NOTE — Assessment & Plan Note (Addendum)
Symptoms are now evolving into bilateral radicular symptoms suggesting some degree of myelopathy. Has already had x-rays and physical therapy. Adding an MRI, etodolac. She will likely need an epidural injection.

## 2015-08-16 NOTE — Progress Notes (Signed)
  Subjective:    CC: Follow-up  HPI: Arm pain: Localized in the left shoulder, and radiation to both forearms with weakness. History of cervical radiculopathy in the past with known cervical degenerative disc disease, overall has discontinued her meloxicam which did help all of her symptoms prior.  Preventative measures: Due for mammogram, colon cancer screening, routine blood work.  Past medical history, Surgical history, Family history not pertinant except as noted below, Social history, Allergies, and medications have been entered into the medical record, reviewed, and no changes needed.   Review of Systems: No fevers, chills, night sweats, weight loss, chest pain, or shortness of breath.   Objective:    General: Well Developed, well nourished, and in no acute distress.  Neuro: Alert and oriented x3, extra-ocular muscles intact, sensation grossly intact.  HEENT: Normocephalic, atraumatic, pupils equal round reactive to light, neck supple, no masses, no lymphadenopathy, thyroid nonpalpable.  Skin: Warm and dry, no rashes. Cardiac: Regular rate and rhythm, no murmurs rubs or gallops, no lower extremity edema.  Respiratory: Clear to auscultation bilaterally. Not using accessory muscles, speaking in full sentences.  Impression and Recommendations:    I spent 25 minutes with this patient, greater than 50% was face-to-face time counseling regarding the above diagnoses

## 2015-08-17 LAB — CBC
HCT: 41.1 % (ref 36.0–46.0)
Hemoglobin: 14.1 g/dL (ref 12.0–15.0)
MCH: 33.1 pg (ref 26.0–34.0)
MCHC: 34.3 g/dL (ref 30.0–36.0)
MCV: 96.5 fL (ref 78.0–100.0)
MPV: 9.5 fL (ref 8.6–12.4)
Platelets: 247 10*3/uL (ref 150–400)
RBC: 4.26 MIL/uL (ref 3.87–5.11)
RDW: 13.3 % (ref 11.5–15.5)
WBC: 5.4 K/uL (ref 4.0–10.5)

## 2015-08-17 LAB — COMPREHENSIVE METABOLIC PANEL WITH GFR
ALT: 12 U/L (ref 6–29)
AST: 19 U/L (ref 10–35)
Albumin: 4.4 g/dL (ref 3.6–5.1)
Alkaline Phosphatase: 44 U/L (ref 33–130)
Calcium: 9.3 mg/dL (ref 8.6–10.4)
Glucose, Bld: 77 mg/dL (ref 65–99)
Total Bilirubin: 0.4 mg/dL (ref 0.2–1.2)

## 2015-08-17 LAB — LIPID PANEL
Cholesterol: 187 mg/dL (ref 125–200)
HDL: 68 mg/dL (ref >=46)
LDL Cholesterol: 106 mg/dL (ref <130)
Total CHOL/HDL Ratio: 2.8 Ratio (ref <=5.0)
Triglycerides: 65 mg/dL (ref <150)
VLDL: 13 mg/dL (ref <30)

## 2015-08-17 LAB — COMPREHENSIVE METABOLIC PANEL
BUN: 6 mg/dL — ABNORMAL LOW (ref 7–25)
CO2: 26 mmol/L (ref 20–31)
Chloride: 97 mmol/L — ABNORMAL LOW (ref 98–110)
Creat: 0.7 mg/dL (ref 0.50–1.05)
Potassium: 4.3 mmol/L (ref 3.5–5.3)
Sodium: 134 mmol/L — ABNORMAL LOW (ref 135–146)
Total Protein: 6.7 g/dL (ref 6.1–8.1)

## 2015-08-17 LAB — HEMOGLOBIN A1C
Hgb A1c MFr Bld: 5.4 % (ref <5.7)
Mean Plasma Glucose: 108 mg/dL (ref <117)

## 2015-08-17 LAB — TSH: TSH: 3.346 u[IU]/mL (ref 0.350–4.500)

## 2015-08-18 LAB — VITAMIN D 25 HYDROXY (VIT D DEFICIENCY, FRACTURES): Vit D, 25-Hydroxy: 16 ng/mL — ABNORMAL LOW (ref 30–100)

## 2015-08-18 MED ORDER — VITAMIN D (ERGOCALCIFEROL) 1.25 MG (50000 UNIT) PO CAPS: 50000.0000 [IU] | ORAL_CAPSULE | ORAL | Status: DC

## 2015-08-18 NOTE — Addendum Note (Signed)
Addended by: Monica BectonHEKKEKANDAM, Vielka Klinedinst J on: 08/18/2015 08:22 AM   Modules accepted: Orders

## 2015-08-28 ENCOUNTER — Other Ambulatory Visit

## 2015-09-04 ENCOUNTER — Ambulatory Visit (INDEPENDENT_AMBULATORY_CARE_PROVIDER_SITE_OTHER)

## 2015-09-04 DIAGNOSIS — M5412 Radiculopathy, cervical region: Secondary | ICD-10-CM

## 2015-09-04 DIAGNOSIS — M50323 Other cervical disc degeneration at C6-C7 level: Secondary | ICD-10-CM

## 2015-09-06 ENCOUNTER — Ambulatory Visit (INDEPENDENT_AMBULATORY_CARE_PROVIDER_SITE_OTHER)

## 2015-09-06 DIAGNOSIS — Z1231 Encounter for screening mammogram for malignant neoplasm of breast: Secondary | ICD-10-CM

## 2015-09-07 LAB — COLOGUARD: Cologuard: NEGATIVE

## 2015-09-13 ENCOUNTER — Ambulatory Visit (INDEPENDENT_AMBULATORY_CARE_PROVIDER_SITE_OTHER): Admitting: Sports Medicine

## 2015-09-13 ENCOUNTER — Encounter: Payer: Self-pay | Admitting: Sports Medicine

## 2015-09-13 DIAGNOSIS — M5412 Radiculopathy, cervical region: Secondary | ICD-10-CM

## 2015-09-13 NOTE — Assessment & Plan Note (Signed)
Multilevel disc protrusions worse at the C4-C5 and C5-C6 levels, we are going to proceed with a left-sided C6-C7 interlaminar epidural injection with medication injected up two levels. Return to see me one month after the injection to evaluate response.

## 2015-09-13 NOTE — Progress Notes (Signed)
  Subjective:    CC: follow-up MRI  HPI: This is a pleasant 55 year old female with a long history of bilateral C5 and C6 radicular symptoms, she has failed physical therapy, NSAIDs, steroids, we recently obtained an MRI for interventional planning. Symptoms are persistent, denies any lower extremity symptoms today, no bowel or bladder dysfunction.  Past medical history, Surgical history, Family history not pertinant except as noted below, Social history, Allergies, and medications have been entered into the medical record, reviewed, and no changes needed.   Review of Systems: No fevers, chills, night sweats, weight loss, chest pain, or shortness of breath.   Objective:    General: Well Developed, well nourished, and in no acute distress.  Neuro: Alert and oriented x3, extra-ocular muscles intact, sensation grossly intact.  HEENT: Normocephalic, atraumatic, pupils equal round reactive to light, neck supple, no masses, no lymphadenopathy, thyroid nonpalpable.  Skin: Warm and dry, no rashes. Cardiac: Regular rate and rhythm, no murmurs rubs or gallops, no lower extremity edema.  Respiratory: Clear to auscultation bilaterally. Not using accessory muscles, speaking in full sentences.  MRI personally reviewed, there is evidence of moderate size disc protrusions at C4-5, C5-6, and C6-7 levels. The C5-6 protrusion does appear to cause some degree of bilateral foraminal compromise.  Impression and Recommendations:

## 2015-09-27 ENCOUNTER — Encounter: Payer: Self-pay | Admitting: Sports Medicine

## 2015-10-11 ENCOUNTER — Other Ambulatory Visit: Payer: Self-pay | Admitting: Sports Medicine

## 2015-10-24 ENCOUNTER — Other Ambulatory Visit: Payer: Self-pay | Admitting: Acute Care

## 2015-10-24 DIAGNOSIS — F17210 Nicotine dependence, cigarettes, uncomplicated: Principal | ICD-10-CM

## 2015-10-25 ENCOUNTER — Ambulatory Visit
Admission: RE | Admit: 2015-10-25 | Discharge: 2015-10-25 | Disposition: A | Discharge status: Home / Self Care | Source: Ambulatory Visit | Attending: Sports Medicine | Admitting: Sports Medicine

## 2015-10-25 ENCOUNTER — Other Ambulatory Visit

## 2015-10-25 MED ORDER — IOHEXOL 300 MG/ML  SOLN
1.0000 mL | Freq: Once | INTRAMUSCULAR | Status: AC | PRN
  Administered 2015-10-25: 1 mL via EPIDURAL

## 2015-10-25 MED ORDER — TRIAMCINOLONE ACETONIDE 40 MG/ML IJ SUSP (RADIOLOGY)
60.0000 mg | Freq: Once | INTRAMUSCULAR | Status: AC
  Administered 2015-10-25: 60 mg via EPIDURAL

## 2015-10-25 NOTE — Discharge Instructions (Signed)

## 2015-11-22 ENCOUNTER — Encounter: Payer: Self-pay | Admitting: Sports Medicine

## 2015-11-22 ENCOUNTER — Ambulatory Visit (INDEPENDENT_AMBULATORY_CARE_PROVIDER_SITE_OTHER): Admitting: Sports Medicine

## 2015-11-22 VITALS — BP 117/73 | HR 63 | Resp 16 | Wt 130.7 lb

## 2015-11-22 DIAGNOSIS — J0100 Acute maxillary sinusitis, unspecified: Secondary | ICD-10-CM | POA: Diagnosis not present

## 2015-11-22 DIAGNOSIS — M5412 Radiculopathy, cervical region: Secondary | ICD-10-CM | POA: Diagnosis not present

## 2015-11-22 MED ORDER — AMOXICILLIN-POT CLAVULANATE 875-125 MG PO TABS: 1.0000 | ORAL_TABLET | Freq: Two times a day (BID) | ORAL | Status: DC

## 2015-11-22 MED ORDER — FLUTICASONE PROPIONATE 50 MCG/ACT NA SUSP: NASAL | Status: DC

## 2015-11-22 NOTE — Progress Notes (Signed)
  Subjective:    CC: Follow-up  HPI: Cervical degenerative disc disease: Doing well after cervical epidural, but only 30% improvement in pain, unaware that she could have a couple more injections.  Past medical history, Surgical history, Family history not pertinant except as noted below, Social history, Allergies, and medications have been entered into the medical record, reviewed, and no changes needed.   Review of Systems: No fevers, chills, night sweats, weight loss, chest pain, or shortness of breath.   Objective:    General: Well Developed, well nourished, and in no acute distress.  Neuro: Alert and oriented x3, extra-ocular muscles intact, sensation grossly intact.  HEENT: Normocephalic, atraumatic, pupils equal round reactive to light, neck supple, no masses, no lymphadenopathy, thyroid nonpalpable.  Skin: Warm and dry, no rashes. Cardiac: Regular rate and rhythm, no murmurs rubs or gallops, no lower extremity edema.  Respiratory: Clear to auscultation bilaterally. Not using accessory muscles, speaking in full sentences.  Impression and Recommendations:   I spent 25 minutes with this patient, greater than 50% was face-to-face time counseling regarding the above diagnoses

## 2015-11-22 NOTE — Assessment & Plan Note (Signed)
30% improvement after initial cervical epidural, we are going to proceed with another left-sided C6-C7 interlaminar epidural. Next line return to see me one month after injection.

## 2015-11-22 NOTE — Assessment & Plan Note (Signed)
Augmentin, Flonase. 

## 2016-02-27 ENCOUNTER — Encounter: Payer: Self-pay | Admitting: Sports Medicine

## 2016-02-27 ENCOUNTER — Ambulatory Visit (INDEPENDENT_AMBULATORY_CARE_PROVIDER_SITE_OTHER): Admitting: Sports Medicine

## 2016-02-27 VITALS — BP 128/75 | HR 85 | Resp 18 | Wt 129.1 lb

## 2016-02-27 DIAGNOSIS — N63 Unspecified lump in breast: Secondary | ICD-10-CM

## 2016-02-27 DIAGNOSIS — N6310 Unspecified lump in the right breast, unspecified quadrant: Secondary | ICD-10-CM | POA: Insufficient documentation

## 2016-02-27 MED ORDER — FLUTICASONE PROPIONATE 50 MCG/ACT NA SUSP: NASAL | Status: DC

## 2016-02-27 MED ORDER — VALACYCLOVIR HCL 500 MG PO TABS: 500.0000 mg | ORAL_TABLET | Freq: Every day | ORAL | Status: DC

## 2016-02-27 NOTE — Progress Notes (Signed)
Patient ID: Bianca Lin, female   DOB: November 22, 1960, 55 y.o.   MRN: [id number]  Subjective:    CC: Right breast tenderness  HPI: 55 yo F who presents with three weeks of tenderness to lateral R breast after feeling a sharp pain when she moved her arm to reach for something.  Since then, she has had constant tenderness to palpation in defined area of breast.  She has normal ROM and no other complaints other than tenderness.  She has been taking etodolac PRN for pain relief but says that the pain is not bad enough to notice if medicine has helped. She is post-menopausal and has a history of hysterectomy, ovaries intact.  Normal mammogram 7 months ago.    Past medical history, Surgical history, Family history not pertinant except as noted below, Social history, Allergies, and medications have been entered into the medical record, reviewed, and no changes needed.   Review of Systems: No fevers, chills, night sweats, weight loss, chest pain, or shortness of breath.   Objective:    General: Well Developed, well nourished, and in no acute distress.  Neuro: Alert and oriented x3, extra-ocular muscles intact, sensation grossly intact.  HEENT: Normocephalic, atraumatic, pupils equal round reactive to light, neck supple, no masses, no lymphadenopathy, thyroid nonpalpable.  Skin: Warm and dry, no rashes. Cardiac: Regular rate and rhythm, no murmurs rubs or gallops, no lower extremity edema.  Respiratory: Clear to auscultation bilaterally. Not using accessory muscles, speaking in full sentences. Breast exam: Tenderness to palpation 3 cm laterally to nipple.  Mobile, dense breast tissue in this area.  Otherwise unremarkable.    Impression and Recommendations:   Likely benign breast cyst.  Will get diagnostic mammogram and US.  Treat symptoms with etodolac.  Follow-up in 4 weeks.     I spent 25 minutes with this patient, greater than 50% was face-to-face time counseling regarding the above diagnoses

## 2016-02-27 NOTE — Assessment & Plan Note (Signed)
Exam is fairly benign, the mass is likely her normal breast tissue. However considering her pain and symptoms we are going to obtain a mammogram, diagnostic as well as a ultrasound. These will have to be done at the breast center. She is many years postmenopausal.

## 2016-03-12 ENCOUNTER — Other Ambulatory Visit: Payer: Self-pay | Admitting: Sports Medicine

## 2016-03-12 DIAGNOSIS — N6310 Unspecified lump in the right breast, unspecified quadrant: Secondary | ICD-10-CM

## 2016-03-25 ENCOUNTER — Other Ambulatory Visit: Payer: Self-pay | Admitting: Sports Medicine

## 2016-03-25 DIAGNOSIS — N6310 Unspecified lump in the right breast, unspecified quadrant: Secondary | ICD-10-CM

## 2016-04-03 ENCOUNTER — Ambulatory Visit
Admission: RE | Admit: 2016-04-03 | Discharge: 2016-04-03 | Disposition: A | Discharge status: Home / Self Care | Source: Ambulatory Visit | Attending: Sports Medicine | Admitting: Sports Medicine

## 2016-04-03 DIAGNOSIS — N6310 Unspecified lump in the right breast, unspecified quadrant: Secondary | ICD-10-CM

## 2016-06-17 ENCOUNTER — Ambulatory Visit (INDEPENDENT_AMBULATORY_CARE_PROVIDER_SITE_OTHER): Admitting: Sports Medicine

## 2016-06-17 VITALS — BP 110/66 | HR 76 | Temp 97.6°F

## 2016-06-17 DIAGNOSIS — Z23 Encounter for immunization: Secondary | ICD-10-CM

## 2016-06-17 NOTE — Progress Notes (Signed)
Patient came into clinic today for flu vaccination. Patient does report some sinus issues (facial pain, congestion, productive cough) for the past 1.5 weeks. Patient reports no fever, and no fever in office today. Spoke with PCP, advised it was OK for flu shot administration. Patient tolerated injection of flu immunization in right deltoid well, with no immediate complications. Advised to schedule appt with PCP for her sinus concerns and to contact our office with any questions.

## 2016-06-18 ENCOUNTER — Ambulatory Visit (INDEPENDENT_AMBULATORY_CARE_PROVIDER_SITE_OTHER): Admitting: Sports Medicine

## 2016-06-18 ENCOUNTER — Encounter: Payer: Self-pay | Admitting: Sports Medicine

## 2016-06-18 DIAGNOSIS — J0100 Acute maxillary sinusitis, unspecified: Secondary | ICD-10-CM

## 2016-06-18 DIAGNOSIS — M5412 Radiculopathy, cervical region: Secondary | ICD-10-CM

## 2016-06-18 MED ORDER — FLUTICASONE PROPIONATE 50 MCG/ACT NA SUSP: NASAL | 11 refills | Status: DC

## 2016-06-18 MED ORDER — AMOXICILLIN-POT CLAVULANATE 875-125 MG PO TABS: 1.0000 | ORAL_TABLET | Freq: Two times a day (BID) | ORAL | 0 refills | Status: AC

## 2016-06-18 NOTE — Assessment & Plan Note (Signed)
Augmentin, Flonase. 

## 2016-06-18 NOTE — Assessment & Plan Note (Signed)
Repeat epidural per patient request. Per patient request.

## 2016-06-18 NOTE — Progress Notes (Signed)
  Subjective:    CC: Sinus infection  HPI: This is a pleasant 55 year old female, for the past 3 weeks she's had pain and pressure with nasal drainage, over her maxillary sinuses, cough, mild sore throat, no constitutional symptoms, no GI symptoms, no rash. Symptoms are moderate, persistent. Pain radiates to the ears and teeth.  She had a cervical epidural about 9 months ago, desires a repeat.  Past medical history:  Negative.  See flowsheet/record as well for more information.  Surgical history: Negative.  See flowsheet/record as well for more information.  Family history: Negative.  See flowsheet/record as well for more information.  Social history: Negative.  See flowsheet/record as well for more information.  Allergies, and medications have been entered into the medical record, reviewed, and no changes needed.   Review of Systems: No fevers, chills, night sweats, weight loss, chest pain, or shortness of breath.   Objective:    General: Well Developed, well nourished, and in no acute distress.  Neuro: Alert and oriented x3, extra-ocular muscles intact, sensation grossly intact.  HEENT: Normocephalic, atraumatic, pupils equal round reactive to light, neck supple, no masses, no lymphadenopathy, thyroid nonpalpable. Oropharynx, nasopharynx, ear canals unremarkable, there is tenderness bilaterally over the maxillary sinuses. Skin: Warm and dry, no rashes. Cardiac: Regular rate and rhythm, no murmurs rubs or gallops, no lower extremity edema.  Respiratory: Clear to auscultation bilaterally. Not using accessory muscles, speaking in full sentences.  Impression and Recommendations:    Acute maxillary sinusitis Augmentin, Flonase.  Left cervical radiculopathy Repeat epidural per patient request. Per patient request.  I spent 25 minutes with this patient, greater than 50% was face-to-face time counseling regarding the above diagnoses

## 2016-06-27 ENCOUNTER — Other Ambulatory Visit

## 2017-04-30 ENCOUNTER — Other Ambulatory Visit: Payer: Self-pay

## 2017-04-30 DIAGNOSIS — J0100 Acute maxillary sinusitis, unspecified: Secondary | ICD-10-CM

## 2017-04-30 MED ORDER — VALACYCLOVIR HCL 500 MG PO TABS: 500.0000 mg | ORAL_TABLET | Freq: Every day | ORAL | 3 refills | Status: DC

## 2017-04-30 MED ORDER — FLUTICASONE PROPIONATE 50 MCG/ACT NA SUSP: NASAL | 11 refills | Status: DC

## 2017-04-30 MED ORDER — VALACYCLOVIR HCL 500 MG PO TABS: 500.0000 mg | ORAL_TABLET | Freq: Every day | ORAL | 11 refills | Status: DC

## 2017-04-30 NOTE — Addendum Note (Signed)
Addended by: Chalmers CaterUTTLE, Jairo Bellew H on: 04/30/2017 03:55 PM   Modules accepted: Orders

## 2017-05-21 ENCOUNTER — Ambulatory Visit (INDEPENDENT_AMBULATORY_CARE_PROVIDER_SITE_OTHER): Admitting: Physician Assistant

## 2017-05-21 DIAGNOSIS — Z23 Encounter for immunization: Secondary | ICD-10-CM

## 2017-12-30 ENCOUNTER — Ambulatory Visit: Admitting: Sports Medicine

## 2018-01-02 ENCOUNTER — Ambulatory Visit (INDEPENDENT_AMBULATORY_CARE_PROVIDER_SITE_OTHER): Admitting: Sports Medicine

## 2018-01-02 ENCOUNTER — Encounter: Payer: Self-pay | Admitting: Sports Medicine

## 2018-01-02 VITALS — BP 107/64 | HR 86 | Resp 18 | Wt 135.0 lb

## 2018-01-02 DIAGNOSIS — R030 Elevated blood-pressure reading, without diagnosis of hypertension: Secondary | ICD-10-CM | POA: Diagnosis not present

## 2018-01-02 DIAGNOSIS — Z0000 Encounter for general adult medical examination without abnormal findings: Secondary | ICD-10-CM

## 2018-01-02 DIAGNOSIS — J0100 Acute maxillary sinusitis, unspecified: Secondary | ICD-10-CM | POA: Diagnosis not present

## 2018-01-02 DIAGNOSIS — Z0184 Encounter for antibody response examination: Secondary | ICD-10-CM

## 2018-01-02 MED ORDER — VALACYCLOVIR HCL 500 MG PO TABS: 500.0000 mg | ORAL_TABLET | Freq: Every day | ORAL | 3 refills | Status: DC

## 2018-01-02 MED ORDER — FLUTICASONE PROPIONATE 50 MCG/ACT NA SUSP: NASAL | 11 refills | Status: DC

## 2018-01-02 NOTE — Progress Notes (Signed)
  Subjective:    CC: Reestablish care  HPI: Bianca Lin is a pleasant 57 year old female, here to essentially reestablish care, I have not seen her in 2 years.  With the recent measles scare she is wanting to have her MMR titers drawn, she was vaccinated but does not remember how many doses, and her sister who is vaccinated at the same time did have her titers done, and she was nonimmune.  She is also due for some other blood tests.  Mammogram.  I reviewed the past medical history, family history, social history, surgical history, and allergies today and no changes were needed.  Please see the problem list section below in epic for further details.  Past Medical History: No past medical history on file. Past Surgical History: No past surgical history on file. Social History: Social History   Socioeconomic History  . Marital status: Married    Spouse name: Not on file  . Number of children: Not on file  . Years of education: Not on file  . Highest education level: Not on file  Occupational History  . Not on file  Social Needs  . Financial resource strain: Not on file  . Food insecurity:    Worry: Not on file    Inability: Not on file  . Transportation needs:    Medical: Not on file    Non-medical: Not on file  Tobacco Use  . Smoking status: Current Every Day Smoker    Packs/day: 0.50  . Smokeless tobacco: Never Used  Substance and Sexual Activity  . Alcohol use: Not on file  . Drug use: Not on file  . Sexual activity: Not on file  Lifestyle  . Physical activity:    Days per week: Not on file    Minutes per session: Not on file  . Stress: Not on file  Relationships  . Social connections:    Talks on phone: Not on file    Gets together: Not on file    Attends religious service: Not on file    Active member of club or organization: Not on file    Attends meetings of clubs or organizations: Not on file    Relationship status: Not on file  Other Topics Concern  . Not on file   Social History Narrative  . Not on file   Family History: No family history on file. Allergies: Allergies  Allergen Reactions  . Sulfonamide Derivatives Itching and Rash   Medications: See med rec.  Review of Systems: No fevers, chills, night sweats, weight loss, chest pain, or shortness of breath.   Objective:    General: Well Developed, well nourished, and in no acute distress.  Neuro: Alert and oriented x3, extra-ocular muscles intact, sensation grossly intact.  HEENT: Normocephalic, atraumatic, pupils equal round reactive to light, neck supple, no masses, no lymphadenopathy, thyroid nonpalpable.  Skin: Warm and dry, no rashes. Cardiac: Regular rate and rhythm, no murmurs rubs or gallops, no lower extremity edema.  Respiratory: Clear to auscultation bilaterally. Not using accessory muscles, speaking in full sentences.  Impression and Recommendations:    Annual physical exam Checking some routine labs, patient has not been here in several years. Adding MMR titers per her request. Refilling medications. Mammogram. Hep C, HIV screening. ___________________________________________ Gwen Her. Dianah Field, M.D., ABFM., CAQSM. Primary Care and [place] Instructor of [employer] [facility] [employer]

## 2018-01-02 NOTE — Assessment & Plan Note (Signed)
Checking some routine labs, patient has not been here in several years. Adding MMR titers per her request. Refilling medications. Mammogram. Hep C, HIV screening.

## 2018-01-05 LAB — COMPREHENSIVE METABOLIC PANEL
AST: 20 U/L (ref 10–35)
Albumin: 4.6 g/dL (ref 3.6–5.1)
Alkaline phosphatase (APISO): 61 U/L (ref 33–130)
Chloride: 100 mmol/L (ref 98–110)
Creat: 0.65 mg/dL (ref 0.50–1.05)
Globulin: 2.2 g/dL (calc) (ref 1.9–3.7)
Glucose, Bld: 89 mg/dL (ref 65–139)
Potassium: 4.2 mmol/L (ref 3.5–5.3)
Sodium: 136 mmol/L (ref 135–146)
Total Protein: 6.8 g/dL (ref 6.1–8.1)

## 2018-01-05 LAB — HEMOGLOBIN A1C
Hgb A1c MFr Bld: 5.2 %{Hb} (ref <5.7)
Mean Plasma Glucose: 103 (calc)
eAG (mmol/L): 5.7 (calc)

## 2018-01-05 LAB — HIV ANTIBODY (ROUTINE TESTING W REFLEX): HIV 1&2 Ab, 4th Generation: NONREACTIVE

## 2018-01-05 LAB — CBC
HCT: 38.9 % (ref 35.0–45.0)
Hemoglobin: 13.8 g/dL (ref 11.7–15.5)
MCH: 32.9 pg (ref 27.0–33.0)
MCHC: 35.5 g/dL (ref 32.0–36.0)
MCV: 92.8 fL (ref 80.0–100.0)
MPV: 9.7 fL (ref 7.5–12.5)
Platelets: 280 10*3/uL (ref 140–400)
RBC: 4.19 10*6/uL (ref 3.80–5.10)
RDW: 12.3 % (ref 11.0–15.0)
WBC: 6.6 Thousand/uL (ref 3.8–10.8)

## 2018-01-05 LAB — COMPREHENSIVE METABOLIC PANEL WITH GFR
AG Ratio: 2.1 (calc) (ref 1.0–2.5)
ALT: 13 U/L (ref 6–29)
BUN: 8 mg/dL (ref 7–25)
CO2: 31 mmol/L (ref 20–32)
Calcium: 10 mg/dL (ref 8.6–10.4)
Total Bilirubin: 0.3 mg/dL (ref 0.2–1.2)

## 2018-01-05 LAB — LIPID PANEL W/REFLEX DIRECT LDL
Cholesterol: 198 mg/dL (ref <200)
HDL: 75 mg/dL (ref >50)
LDL Cholesterol (Calc): 105 mg/dL (calc) — ABNORMAL HIGH
Non-HDL Cholesterol (Calc): 123 mg/dL (calc) (ref <130)
Total CHOL/HDL Ratio: 2.6 (calc) (ref <5.0)
Triglycerides: 89 mg/dL (ref <150)

## 2018-01-05 LAB — HEPATITIS C ANTIBODY
Hepatitis C Ab: NONREACTIVE
SIGNAL TO CUT-OFF: 0.02 (ref <1.00)

## 2018-01-05 LAB — MEASLES/MUMPS/RUBELLA IMMUNITY
Mumps IgG: <9 AU/mL — ABNORMAL LOW
Rubella: 25.5 {index}
Rubeola IgG: 53.5 AU/mL

## 2018-01-05 LAB — VARICELLA ZOSTER ANTIBODY, IGG: Varicella IgG: 1421 {index}

## 2018-01-05 LAB — TSH: TSH: 2.69 mIU/L (ref 0.40–4.50)

## 2018-01-08 ENCOUNTER — Ambulatory Visit (INDEPENDENT_AMBULATORY_CARE_PROVIDER_SITE_OTHER)

## 2018-01-08 DIAGNOSIS — Z1231 Encounter for screening mammogram for malignant neoplasm of breast: Secondary | ICD-10-CM

## 2018-04-30 ENCOUNTER — Ambulatory Visit (INDEPENDENT_AMBULATORY_CARE_PROVIDER_SITE_OTHER): Admitting: Sports Medicine

## 2018-04-30 DIAGNOSIS — Z23 Encounter for immunization: Secondary | ICD-10-CM | POA: Diagnosis not present

## 2018-05-21 ENCOUNTER — Ambulatory Visit (INDEPENDENT_AMBULATORY_CARE_PROVIDER_SITE_OTHER): Admitting: Sports Medicine

## 2018-05-21 VITALS — BP 115/54 | HR 83 | Temp 97.6°F | Wt 143.6 lb

## 2018-05-21 DIAGNOSIS — Z23 Encounter for immunization: Secondary | ICD-10-CM

## 2018-05-21 NOTE — Progress Notes (Signed)
Pt is here for MMR injection #1. Denies chest pain, SOB, lightheadedness, palpitations or dizziness. Pt had complaint of Right elbow swelling for 1 week. As per pt, area is "warm to the touch and sore". Pt was offered an appt this afternoon to be evaluated, however she had other obligations today. Pt did agree to an appt tomorrow at 9 am with Dr. Georgina Snell. Bp reading was 115/54, pulse 83. Pt tolerated injection well on Left arm (subcut.) without any complications. Pt was instructed to make an appt for 2nd injection in 4 weeks.

## 2018-05-22 ENCOUNTER — Encounter: Payer: Self-pay | Admitting: Family Medicine

## 2018-05-22 ENCOUNTER — Ambulatory Visit (INDEPENDENT_AMBULATORY_CARE_PROVIDER_SITE_OTHER): Admitting: Family Medicine

## 2018-05-22 VITALS — BP 72/52 | HR 88 | Ht 66.0 in | Wt 143.0 lb

## 2018-05-22 DIAGNOSIS — L03113 Cellulitis of right upper limb: Secondary | ICD-10-CM

## 2018-05-22 DIAGNOSIS — M7021 Olecranon bursitis, right elbow: Secondary | ICD-10-CM | POA: Diagnosis not present

## 2018-05-22 MED ORDER — DOXYCYCLINE HYCLATE 100 MG PO TABS: 100.0000 mg | ORAL_TABLET | Freq: Two times a day (BID) | ORAL | 0 refills | Status: DC

## 2018-05-22 NOTE — Patient Instructions (Signed)
Thank you for coming in today. Start doxycycline twice daily.  Do not take with calcium or iron (multi vitamin, tums, calcium pill or dairy) Ok to take with a little food.  If you have diarrhea take over the counter imodium.  Recheck if not improved.  Once the redness if gone apply a padded compression sleeve to the elbow.  Body Helix full elbow.    Cellulitis, Adult Cellulitis is a skin infection. The infected area is usually red and tender. This condition occurs most often in the arms and lower legs. The infection can travel to the muscles, blood, and underlying tissue and become serious. It is very important to get treated for this condition. What are the causes? Cellulitis is caused by bacteria. The bacteria enter through a break in the skin, such as a cut, burn, insect bite, open sore, or crack. What increases the risk? This condition is more likely to occur in people who:  Have a weak defense system (immune system).  Have open wounds on the skin such as cuts, burns, bites, and scrapes. Bacteria can enter the body through these open wounds.  Are older.  Have diabetes.  Have a type of long-lasting (chronic) liver disease (cirrhosis) or kidney disease.  Use IV drugs.  What are the signs or symptoms? Symptoms of this condition include:  Redness, streaking, or spotting on the skin.  Swollen area of the skin.  Tenderness or pain when an area of the skin is touched.  Warm skin.  Fever.  Chills.  Blisters.  How is this diagnosed? This condition is diagnosed based on a medical history and physical exam. You may also have tests, including:  Blood tests.  Lab tests.  Imaging tests.  How is this treated? Treatment for this condition may include:  Medicines, such as antibiotic medicines or antihistamines.  Supportive care, such as rest and application of cold or warm cloths (cold or warm compresses) to the skin.  Hospital care, if the condition is severe.  The  infection usually gets better within 1-2 days of treatment. Follow these instructions at home:  Take over-the-counter and prescription medicines only as told by your health care provider.  If you were prescribed an antibiotic medicine, take it as told by your health care provider. Do not stop taking the antibiotic even if you start to feel better.  Drink enough fluid to keep your urine clear or pale yellow.  Do not touch or rub the infected area.  Raise (elevate) the infected area above the level of your heart while you are sitting or lying down.  Apply warm or cold compresses to the affected area as told by your health care provider.  Keep all follow-up visits as told by your health care provider. This is important. These visits let your health care provider make sure a more serious infection is not developing. Contact a health care provider if:  You have a fever.  Your symptoms do not improve within 1-2 days of starting treatment.  Your bone or joint underneath the infected area becomes painful after the skin has healed.  Your infection returns in the same area or another area.  You notice a swollen bump in the infected area.  You develop new symptoms.  You have a general ill feeling (malaise) with muscle aches and pains. Get help right away if:  Your symptoms get worse.  You feel very sleepy.  You develop vomiting or diarrhea that persists.  You notice red streaks coming from the  infected area.  Your red area gets larger or turns dark in color. This information is not intended to replace advice given to you by your health care provider. Make sure you discuss any questions you have with your health care provider. Document Released: 05/15/2005 Document Revised: 12/14/2015 Document Reviewed: 06/14/2015 Elsevier Interactive Patient Education  [employer].
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
## 2018-05-26 ENCOUNTER — Telehealth: Payer: Self-pay

## 2018-05-26 MED ORDER — CLINDAMYCIN HCL 300 MG PO CAPS: 300.0000 mg | ORAL_CAPSULE | Freq: Three times a day (TID) | ORAL | 0 refills | Status: DC

## 2018-05-26 NOTE — Telephone Encounter (Signed)
Spoke to patient advised her of new medication sent to her pharmacy. Bianca Lin,CMA

## 2018-05-26 NOTE — Telephone Encounter (Signed)
Patient called stated that she has stopped taking Doxycycline because it makes her dizzy, she stated that she is feeling better now but wants to know if there is something else that can be sent to her pharmacy. Verlene Glantz,CMA

## 2018-05-26 NOTE — Telephone Encounter (Signed)
Stop doxycycline.  Start clindamycin.  Medication sent to pharmacy.

## 2018-06-18 ENCOUNTER — Ambulatory Visit

## 2018-06-19 ENCOUNTER — Ambulatory Visit (INDEPENDENT_AMBULATORY_CARE_PROVIDER_SITE_OTHER): Admitting: Physician Assistant

## 2018-06-19 VITALS — Temp 97.6°F

## 2018-06-19 DIAGNOSIS — Z23 Encounter for immunization: Secondary | ICD-10-CM

## 2018-06-19 NOTE — Progress Notes (Signed)
   Subjective:    Patient ID: Bianca Lin, female    DOB: 1961-06-08, 57 y.o.   MRN: [id number]  HPI  Bianca Lin is here for 2nd MMR vaccine.  Review of Systems     Objective:   Physical Exam  Vitals:   06/19/18 1016  Temp: 97.6 F (36.4 C)         Assessment & Plan:  Patient tolerated injection well without complications.

## 2018-06-19 NOTE — Progress Notes (Deleted)
Pt came into clinic today for second Shingrix, and flu vaccine. Pt reports the first Shingrix vaccine caused some redness/edema at injection site. Tolerated injection of Shingrix in left deltoid, and flu in right deltoid well. No immediate complications. Advised to contact clinic with any questions or concerns.

## 2018-06-23 ENCOUNTER — Ambulatory Visit: Admitting: Family Medicine

## 2018-10-05 ENCOUNTER — Telehealth: Payer: Self-pay | Admitting: Sports Medicine

## 2018-10-05 NOTE — Telephone Encounter (Signed)
Patient has labs completed on 01/02/18, she is being billed for $1200.00.  When I contacted [facility], the only diagnosis code used was Z00.00.  We are not able to use that diagnosis code for all the labs completed on 01/02/18.  Please review the labs and send me the correct diagnosis codes so that I can call [facility] to refile.  Thanks so much!!

## 2018-10-06 NOTE — Telephone Encounter (Signed)
I have added some difft diagnosis codes but I can't link them to the labs from that visit.    Please link everything to: R03.0 EXCEPT the titers, link those to Z01.84.  Hep C and HIV testing should be left linked to Z00.00.

## 2019-02-25 ENCOUNTER — Other Ambulatory Visit: Payer: Self-pay | Admitting: *Deleted

## 2019-02-25 DIAGNOSIS — J0100 Acute maxillary sinusitis, unspecified: Secondary | ICD-10-CM

## 2019-02-25 MED ORDER — VALACYCLOVIR HCL 500 MG PO TABS: 500.0000 mg | ORAL_TABLET | Freq: Every day | ORAL | 3 refills | Status: DC

## 2019-02-25 MED ORDER — FLUTICASONE PROPIONATE 50 MCG/ACT NA SUSP: NASAL | 11 refills | Status: DC

## 2021-04-18 ENCOUNTER — Ambulatory Visit (INDEPENDENT_AMBULATORY_CARE_PROVIDER_SITE_OTHER)

## 2021-04-18 ENCOUNTER — Other Ambulatory Visit: Payer: Self-pay

## 2021-04-18 ENCOUNTER — Ambulatory Visit (INDEPENDENT_AMBULATORY_CARE_PROVIDER_SITE_OTHER): Admitting: Sports Medicine

## 2021-04-18 DIAGNOSIS — M79671 Pain in right foot: Secondary | ICD-10-CM

## 2021-04-18 DIAGNOSIS — M7989 Other specified soft tissue disorders: Secondary | ICD-10-CM | POA: Diagnosis not present

## 2021-04-18 DIAGNOSIS — Z23 Encounter for immunization: Secondary | ICD-10-CM

## 2021-04-18 NOTE — Assessment & Plan Note (Signed)
For several weeks this pleasant 60 year old female has noted increasing swelling in her right foot, with pain over the metatarsal shafts, no change in activity level, she has been wearing poorly supportive sandals through the summer. On exam she has significant fusiform swelling of the foot, lesser so of the ankle, no discrete areas of tenderness to palpation of the ankle however she does have severe pain to palpation on the third and fourth metatarsal shafts. I do suspect stress injury, adding a postop shoe, x-rays, she will come back in about 6 weeks and we can consider MRI if not significantly better.

## 2021-04-18 NOTE — Addendum Note (Signed)
Addended by: Gaynelle Arabian on: 04/18/2021 11:50 AM   Modules accepted: Orders

## 2021-04-18 NOTE — Progress Notes (Signed)
    Procedures performed today:    None.  Independent interpretation of notes and tests performed by another provider:   None.  Brief History, Exam, Impression, and Recommendations:    Swelling of right foot For several weeks this pleasant 60 year old female has noted increasing swelling in her right foot, with pain over the metatarsal shafts, no change in activity level, she has been wearing poorly supportive sandals through the summer. On exam she has significant fusiform swelling of the foot, lesser so of the ankle, no discrete areas of tenderness to palpation of the ankle however she does have severe pain to palpation on the third and fourth metatarsal shafts. I do suspect stress injury, adding a postop shoe, x-rays, she will come back in about 6 weeks and we can consider MRI if not significantly better.    ___________________________________________ Bianca Lin. Benjamin Stain, M.D., ABFM., CAQSM. Primary Care and Sports Medicine [facility]  Adjunct Instructor of Family Medicine  [employer]

## 2021-05-08 ENCOUNTER — Other Ambulatory Visit: Payer: Self-pay

## 2021-05-08 DIAGNOSIS — J0100 Acute maxillary sinusitis, unspecified: Secondary | ICD-10-CM

## 2021-05-08 MED ORDER — FLUTICASONE PROPIONATE 50 MCG/ACT NA SUSP: NASAL | 11 refills | Status: DC

## 2021-05-08 MED ORDER — VALACYCLOVIR HCL 500 MG PO TABS: 500.0000 mg | ORAL_TABLET | Freq: Every day | ORAL | 3 refills | Status: DC

## 2021-05-08 NOTE — Telephone Encounter (Signed)
Patient called and requested refills be sent to [facility] mail order pharmacy.

## 2021-05-30 ENCOUNTER — Encounter: Payer: Self-pay | Admitting: Sports Medicine

## 2021-05-30 ENCOUNTER — Ambulatory Visit (INDEPENDENT_AMBULATORY_CARE_PROVIDER_SITE_OTHER): Admitting: Sports Medicine

## 2021-05-30 VITALS — BP 124/69 | HR 91 | Ht 66.0 in | Wt 155.0 lb

## 2021-05-30 DIAGNOSIS — Z1211 Encounter for screening for malignant neoplasm of colon: Secondary | ICD-10-CM | POA: Diagnosis not present

## 2021-05-30 DIAGNOSIS — Z0000 Encounter for general adult medical examination without abnormal findings: Secondary | ICD-10-CM | POA: Diagnosis not present

## 2021-05-30 DIAGNOSIS — Z1231 Encounter for screening mammogram for malignant neoplasm of breast: Secondary | ICD-10-CM

## 2021-05-30 DIAGNOSIS — J432 Centrilobular emphysema: Secondary | ICD-10-CM | POA: Diagnosis not present

## 2021-05-30 DIAGNOSIS — E871 Hypo-osmolality and hyponatremia: Secondary | ICD-10-CM

## 2021-05-30 DIAGNOSIS — M7989 Other specified soft tissue disorders: Secondary | ICD-10-CM | POA: Diagnosis not present

## 2021-05-30 NOTE — Assessment & Plan Note (Signed)
Bianca Lin has now been in a postop shoe now for about 6 weeks, she was having fusiform swelling of the foot, we suspected a stress injury, x-rays were unremarkable as expected, she no longer has tenderness to palpation on the third and fourth metatarsal shafts. I think she can discontinue her postop shoe, she would like another set of custom orthotics so I would like her to see Dr. Jordan Likes.

## 2021-05-30 NOTE — Assessment & Plan Note (Signed)
Considering her emphysema I do think she would be a candidate for pneumococcal vaccination as well, I do not see that she has had prior vaccinations so we will do Prevnar 20 when she comes back for her Shingrix.
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
## 2021-05-30 NOTE — Assessment & Plan Note (Addendum)
Annual physical as above. She is up-to-date on most vaccinations, she does need Shingrix but just had another shot so we will bring her back for that. Ordering mammogram and Cologuard. Considering her emphysema I do think she would be a candidate for pneumococcal vaccination as well, I do not see that she has had prior vaccinations so we will do Prevnar 20 when she comes back for her Shingrix.

## 2021-05-31 DIAGNOSIS — E871 Hypo-osmolality and hyponatremia: Secondary | ICD-10-CM | POA: Insufficient documentation

## 2021-05-31 LAB — COMPREHENSIVE METABOLIC PANEL
AG Ratio: 2.1 (calc) (ref 1.0–2.5)
ALT: 19 U/L (ref 6–29)
AST: 24 U/L (ref 10–35)
Albumin: 5 g/dL (ref 3.6–5.1)
Alkaline phosphatase (APISO): 80 U/L (ref 37–153)
BUN/Creatinine Ratio: 9 (calc) (ref 6–22)
BUN: 6 mg/dL — ABNORMAL LOW (ref 7–25)
CO2: 30 mmol/L (ref 20–32)
Calcium: 10.4 mg/dL (ref 8.6–10.4)
Chloride: 92 mmol/L — ABNORMAL LOW (ref 98–110)
Creat: 0.64 mg/dL (ref 0.50–1.05)
Globulin: 2.4 g/dL (calc) (ref 1.9–3.7)
Glucose, Bld: 95 mg/dL (ref 65–99)
Potassium: 4.8 mmol/L (ref 3.5–5.3)
Sodium: 130 mmol/L — ABNORMAL LOW (ref 135–146)
Total Bilirubin: 0.4 mg/dL (ref 0.2–1.2)
Total Protein: 7.4 g/dL (ref 6.1–8.1)

## 2021-05-31 LAB — CBC
HCT: 44.5 % (ref 35.0–45.0)
Hemoglobin: 14.9 g/dL (ref 11.7–15.5)
MCH: 31.6 pg (ref 27.0–33.0)
MCHC: 33.5 g/dL (ref 32.0–36.0)
MCV: 94.3 fL (ref 80.0–100.0)
MPV: 9.8 fL (ref 7.5–12.5)
Platelets: 305 10*3/uL (ref 140–400)
RBC: 4.72 10*6/uL (ref 3.80–5.10)
RDW: 12.1 % (ref 11.0–15.0)
WBC: 6.3 10*3/uL (ref 3.8–10.8)

## 2021-05-31 LAB — LIPID PANEL
Cholesterol: 235 mg/dL — ABNORMAL HIGH (ref <200)
HDL: 85 mg/dL (ref >=50)
LDL Cholesterol (Calc): 127 mg/dL (calc) — ABNORMAL HIGH
Non-HDL Cholesterol (Calc): 150 mg/dL (calc) — ABNORMAL HIGH (ref <130)
Total CHOL/HDL Ratio: 2.8 (calc) (ref <5.0)
Triglycerides: 115 mg/dL (ref <150)

## 2021-05-31 LAB — TSH: TSH: 2.15 mIU/L (ref 0.40–4.50)

## 2021-05-31 NOTE — Assessment & Plan Note (Addendum)
Unclear etiology, SIADH, versus primary polydipsia vs SCLC. Not on any medications that would result in hyponatremia. We will recheck in a month and if still low we will aggressively work this up.

## 2021-05-31 NOTE — Addendum Note (Signed)
Addended by: Monica Becton on: 05/31/2021 08:48 AM   Modules accepted: Orders

## 2021-06-21 ENCOUNTER — Encounter: Payer: Self-pay | Admitting: Family Medicine

## 2021-06-21 ENCOUNTER — Ambulatory Visit (INDEPENDENT_AMBULATORY_CARE_PROVIDER_SITE_OTHER): Admitting: Family Medicine

## 2021-06-21 DIAGNOSIS — M7989 Other specified soft tissue disorders: Secondary | ICD-10-CM

## 2021-06-21 LAB — COLOGUARD: COLOGUARD: NEGATIVE

## 2021-06-21 NOTE — Assessment & Plan Note (Signed)
Has translation of the right medial hindfoot.  Does appear to have some pain over the navicular area. -Counseled on home exercise therapy and supportive care. -Orthotics. -Right foot with medial heel wedge
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
## 2021-07-05 ENCOUNTER — Ambulatory Visit (INDEPENDENT_AMBULATORY_CARE_PROVIDER_SITE_OTHER)

## 2021-07-05 ENCOUNTER — Other Ambulatory Visit: Payer: Self-pay

## 2021-07-05 DIAGNOSIS — Z1231 Encounter for screening mammogram for malignant neoplasm of breast: Secondary | ICD-10-CM

## 2021-07-07 ENCOUNTER — Telehealth: Payer: Self-pay | Admitting: Sports Medicine

## 2021-07-07 DIAGNOSIS — E871 Hypo-osmolality and hyponatremia: Secondary | ICD-10-CM

## 2021-07-07 NOTE — Telephone Encounter (Signed)
-----   Message from Carolin Coy, [place] sent at 07/05/2021  2:35 PM EST ----- Regarding: FW: Shingrix #1 In the last OV, patient had hyponatremia and was to follow up in one month for labs. Please place orders. Please is being scheduled for nurse visit for below vaccines as well.  ----- Message ----- From: Carolin Coy, CMA Sent: 07/02/2021  12:00 AM EST To: Carolin Coy, CMA Subject: Shingrix #1                                    05/30/21 - annual visit told to return for shingrix and prevnar 20

## 2021-07-09 NOTE — Telephone Encounter (Signed)
Orders placed for labs to recheck hyponatremia.

## 2021-07-10 ENCOUNTER — Ambulatory Visit (INDEPENDENT_AMBULATORY_CARE_PROVIDER_SITE_OTHER): Admitting: Sports Medicine

## 2021-07-10 ENCOUNTER — Other Ambulatory Visit: Payer: Self-pay

## 2021-07-10 VITALS — BP 142/79 | HR 89

## 2021-07-10 DIAGNOSIS — E871 Hypo-osmolality and hyponatremia: Secondary | ICD-10-CM

## 2021-07-10 DIAGNOSIS — Z23 Encounter for immunization: Secondary | ICD-10-CM | POA: Diagnosis not present

## 2021-07-10 NOTE — Progress Notes (Signed)
Pt here for Shingrix vaccine #1.  Screening questionnaire reviewed, VIS provided to patient, and any/all patient questions answered.  Tiajuana Amass, CMA

## 2021-07-11 LAB — BASIC METABOLIC PANEL
BUN: 8 mg/dL (ref 7–25)
CO2: 32 mmol/L (ref 20–32)
Calcium: 10.4 mg/dL (ref 8.6–10.4)
Chloride: 97 mmol/L — ABNORMAL LOW (ref 98–110)
Creat: 0.58 mg/dL (ref 0.50–1.05)
Glucose, Bld: 81 mg/dL (ref 65–99)
Potassium: 4.8 mmol/L (ref 3.5–5.3)
Sodium: 134 mmol/L — ABNORMAL LOW (ref 135–146)

## 2021-07-24 ENCOUNTER — Ambulatory Visit

## 2021-07-31 ENCOUNTER — Ambulatory Visit

## 2022-06-24 ENCOUNTER — Encounter: Payer: Self-pay | Admitting: Sports Medicine

## 2022-06-24 ENCOUNTER — Ambulatory Visit (INDEPENDENT_AMBULATORY_CARE_PROVIDER_SITE_OTHER): Admitting: Sports Medicine

## 2022-06-24 VITALS — BP 132/72 | HR 92 | Wt 152.0 lb

## 2022-06-24 DIAGNOSIS — J432 Centrilobular emphysema: Secondary | ICD-10-CM

## 2022-06-24 DIAGNOSIS — J0100 Acute maxillary sinusitis, unspecified: Secondary | ICD-10-CM | POA: Diagnosis not present

## 2022-06-24 DIAGNOSIS — Z0000 Encounter for general adult medical examination without abnormal findings: Secondary | ICD-10-CM

## 2022-06-24 MED ORDER — PREVNAR 20 0.5 ML IM SUSY: 0.5000 mL | PREFILLED_SYRINGE | Freq: Once | INTRAMUSCULAR | 0 refills | Status: AC

## 2022-06-24 MED ORDER — VALACYCLOVIR HCL 500 MG PO TABS: 500.0000 mg | ORAL_TABLET | Freq: Every day | ORAL | 3 refills | Status: AC

## 2022-06-24 MED ORDER — FLUTICASONE PROPIONATE 50 MCG/ACT NA SUSP: NASAL | 11 refills | Status: AC

## 2022-06-24 NOTE — Assessment & Plan Note (Addendum)
Annual physical as above, second Shingrix was done at her pharmacy May 2023, she had a flu shot September 2023, sending in pneumococcal vaccination to her pharmacy.  Had Pneumovax 23 approximately 8 years ago, has not had Prevnar 36, she is at a high risk for pneumococcal disease due to centrilobular emphysema, sending Prevnar 20 to pharmacy. She will check her records to see if she has had Tdap within 10 years. Adding routine labs. She will get them done next week.
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
## 2022-08-15 ENCOUNTER — Telehealth: Payer: Self-pay | Admitting: Sports Medicine

## 2022-08-15 NOTE — Telephone Encounter (Signed)
Please call pt to see about her Prevnar 20 vaccine that was called into her pharmacy. She is asking for next steps. Thank you.

## 2022-08-21 NOTE — Telephone Encounter (Signed)
Called patient - she states the problem is that she was billed for a prevnar 20 at her last visit with Dr. Darene Lamer but she did not receive a vaccine at all . She had stated that she wanted to get this at the pharmacy . She wants to be able to get the vaccine but since it is showing billed through her insurance she can no longer get the vaccine as covered through her insurance.  I am forwarding this to Shaune Pascal to review.

## 2022-08-28 NOTE — Telephone Encounter (Signed)
Patient advised we did not bill for the pneumonia vaccine. It was sent to [facility] as a prescription and they ran a claim.

## 2022-12-03 ENCOUNTER — Encounter: Payer: Self-pay | Admitting: *Deleted

## 2023-05-30 ENCOUNTER — Encounter: Admitting: Sports Medicine

## 2023-06-02 ENCOUNTER — Encounter: Admitting: Sports Medicine

## 2024-04-22 ENCOUNTER — Encounter: Payer: Self-pay | Admitting: Sports Medicine

## 2024-07-21 ENCOUNTER — Ambulatory Visit: Payer: Self-pay

## 2024-07-21 NOTE — Telephone Encounter (Signed)
 Patient shows scheduled for 07/22/2024 video visit  with Zada Palin, NP

## 2024-07-21 NOTE — Telephone Encounter (Signed)
 FYI Only or Action Required?: FYI only for provider: appointment scheduled on 07/22/24.   Patient was last seen in primary care on 06/24/2022 by Curtis Debby PARAS, MD.  Called Nurse Triage reporting Nasal Congestion.  Symptoms began several days ago.  Interventions attempted: OTC medications: NA.  Symptoms are: gradually worsening.  Triage Disposition: See PCP When Office is Open (Within 3 Days)  Patient/caregiver understands and will follow disposition?: Yes Reason for Disposition  Lots of coughing  Answer Assessment - Initial Assessment Questions 1. LOCATION: Where does it hurt?      Cheeks I can feel they're puffy  2. ONSET: When did the sinus pain start?  (e.g., hours, days)      Saturday morning  3. SEVERITY: How bad is the pain?   (Scale 0-10; or none, mild, moderate or severe)     For sinuses it's an 8/10.  4. RECURRENT SYMPTOM: Have you ever had sinus problems before? If Yes, ask: When was the last time? and What happened that time?      Yes, off and on  5. NASAL CONGESTION: Is the nose blocked? If Yes, ask: Can you open it or must you breathe through your mouth?     Able to clear congestion  6. NASAL DISCHARGE: Do you have discharge from your nose? If so ask, What color?     Green/light yellow  7. FEVER: Do you have a fever? If Yes, ask: What is it, how was it measured, and when did it start?      No  8. OTHER SYMPTOMS: Do you have any other symptoms? (e.g., sore throat, cough, earache, difficulty breathing)     Sore throat, runny nose, severe cough  9. PREGNANCY: Is there any chance you are pregnant? When was your last menstrual period?     NA  Protocols used: Sinus Pain or Congestion-[facility]  Copied from CRM #[id number]. Topic: Clinical - Red Word Triage >> Jul 21, 2024 10:14 AM Winona R wrote: Experiencing cough, runny nose, yellow/ green phlegm, throat hurst especially when she cough.

## 2024-07-22 ENCOUNTER — Telehealth: Admitting: Medical-Surgical

## 2024-07-22 DIAGNOSIS — J329 Chronic sinusitis, unspecified: Secondary | ICD-10-CM | POA: Diagnosis not present

## 2024-07-22 DIAGNOSIS — J40 Bronchitis, not specified as acute or chronic: Secondary | ICD-10-CM

## 2024-07-22 DIAGNOSIS — B9689 Other specified bacterial agents as the cause of diseases classified elsewhere: Secondary | ICD-10-CM | POA: Diagnosis not present

## 2024-07-22 DIAGNOSIS — F17210 Nicotine dependence, cigarettes, uncomplicated: Secondary | ICD-10-CM | POA: Diagnosis not present

## 2024-07-22 MED ORDER — AMOXICILLIN-POT CLAVULANATE 875-125 MG PO TABS: 1.0000 | ORAL_TABLET | Freq: Two times a day (BID) | ORAL | 0 refills | Status: AC

## 2024-07-22 MED ORDER — AZELASTINE HCL 0.1 % NA SOLN: 2.0000 | Freq: Two times a day (BID) | NASAL | 11 refills | Status: AC

## 2024-07-22 MED ORDER — FLUCONAZOLE 150 MG PO TABS: 150.0000 mg | ORAL_TABLET | Freq: Once | ORAL | 0 refills | Status: AC

## 2024-07-22 MED ORDER — HYDROCOD POLI-CHLORPHE POLI ER 10-8 MG/5ML PO SUER: 5.0000 mL | Freq: Two times a day (BID) | ORAL | 0 refills | Status: AC | PRN

## 2024-07-22 NOTE — Progress Notes (Signed)
 Virtual Visit via Video Note  I connected with Bianca Lin on 07/23/24 at 10:30 AM EST by a video enabled telemedicine application and verified that I am speaking with the correct person using two identifiers.   I discussed the limitations of evaluation and management by telemedicine and the availability of in person appointments. The patient expressed understanding and agreed to proceed.  Patient location: home Provider locations: office  Subjective:    CC: respiratory symptoms  HPI: Pleasant 63 year old female presenting via MyChart video visit with reports of 5-6 days of respiratory symptoms including sore throat, nasal congestion, chest congestion, productive cough, and postnasal drip.  No fever, chills, or GI symptoms.  Has been using Zyrtec-D and Alka-Seltzer severe cold without benefit.  History notable for centrilobular emphysema.   Past medical history, Surgical history, Family history not pertinant except as noted below, Social history, Allergies, and medications have been entered into the medical record, reviewed, and corrections made.   Review of Systems: See HPI for pertinent positives and negatives.   Objective:    General: Speaking clearly in complete sentences without any shortness of breath.  Alert and oriented x3.  Normal judgment. No apparent acute distress.  Impression and Recommendations:    1. Sinobronchitis (Primary) Treating for sinobronchitis/possible COPD exacerbation with Augmentin  twice daily x 10 days, Tussionex for cough suppression, and azelastine  nasal spray.  Adding Diflucan  for VVC prophylaxis.  Okay to continue other conservative measures. - amoxicillin -clavulanate (AUGMENTIN ) 875-125 MG tablet; Take 1 tablet by mouth 2 (two) times daily.  Dispense: 20 tablet; Refill: 0 - chlorpheniramine-HYDROcodone (TUSSIONEX) 10-8 MG/5ML; Take 5 mLs by mouth every 12 (twelve) hours as needed for cough (cough, will cause drowsiness.).  Dispense: 115 mL; Refill:  0 - azelastine  (ASTELIN ) 0.1 % nasal spray; Place 2 sprays into both nostrils 2 (two) times daily. Use in each nostril as directed  Dispense: 30 mL; Refill: 11 - fluconazole  (DIFLUCAN ) 150 MG tablet; Take 1 tablet (150 mg total) by mouth once for 1 dose. Repeat dose 72 hours if yeast infection persists  Dispense: 2 tablet; Refill: 0  I discussed the assessment and treatment plan with the patient. The patient was provided an opportunity to ask questions and all were answered. The patient agreed with the plan and demonstrated an understanding of the instructions.   The patient was advised to call back or seek an in-person evaluation if the symptoms worsen or if the condition fails to improve as anticipated.  Return if symptoms worsen or fail to improve.  Zada FREDRIK Palin, DNP, APRN, FNP-BC [facility]

## 2024-07-23 ENCOUNTER — Ambulatory Visit: Payer: Self-pay

## 2024-07-23 ENCOUNTER — Encounter: Payer: Self-pay | Admitting: Medical-Surgical

## 2024-07-23 ENCOUNTER — Other Ambulatory Visit: Payer: Self-pay | Admitting: Medical-Surgical

## 2024-07-23 MED ORDER — METHYLPREDNISOLONE 4 MG PO TBPK: ORAL_TABLET | ORAL | 0 refills | Status: AC

## 2024-07-23 NOTE — Telephone Encounter (Signed)
 Patient advised.

## 2024-07-23 NOTE — Telephone Encounter (Signed)
 FYI Only or Action Required?: Action required by provider: update on patient condition.  Patient was last seen in primary care on 07/22/2024 by Willo Mini, NP.  Called Nurse Triage reporting Cough.  Symptoms began several days ago.  Interventions attempted: Prescription medications: Astelin  nasal spray, Augmentin , tussionex; OTC: zyrtec-D.  Symptoms are: gradually improving.  Triage Disposition: Call PCP Now  Patient/caregiver understands and will follow disposition?: Yes          Copied from CRM [id number]. Topic: Clinical - Red Word Triage >> Jul 23, 2024  9:58 AM Mercer PEDLAR wrote: Red Word that prompted transfer to Nurse Triage: Difficulty breathing, patient Is requesting a prescription for steroids and for an inhaler. Patient stated that when she saw Mini Willo, NP on 07/22/24 she had declined the steroid prescription but woke up feeling worse today and would like to try it. Reason for Disposition  [1] Caller has URGENT question (includes prescribed medication questions) AND [2] triager unable to answer question  Answer Assessment - Initial Assessment Questions 1. MAIN CONCERN OR SYMPTOM:  What is your main concern right now? What question do you have? What's the main symptom you're worried about? (e.g., breathing difficulty, cough, fever, pain)     Patient states she has changed her mind about the steroid the provider offered, she would like that called in and a rescue inhaler. She states the rescue inhaler, just in case I need that later. She states she does not feel SOB but feels like she isn't taking a full breath, especially bad at night. Patient speaking in full sentences, no wheezing or labored breathing noted.  2. ONSET: When did the  breathing changes  start?     X few days.  3. BETTER-SAME-WORSE: Are you getting better, staying the same, or getting worse compared to how you felt at your last visit to the doctor (most recent medical visit)?     Cough is  better; nasal symptoms better; breathing is the same.  4. VISIT DATE: When were you seen? (e.g., date)     Yesterday 07/22/24.  5. VISIT DOCTOR: What is the name of the doctor taking care of you now?     Mini Willo, NP.  6. VISIT DIAGNOSIS:  What was the main symptom or problem that you were seen for? Were you given a diagnosis?      Sinobronchitis/COPD exacerbation.  7. VISIT MEDICINES: Did the doctor order any new medicines for you to use? If Yes, ask: Have you filled the prescription and started taking the medicine?      - amoxicillin -clavulanate (AUGMENTIN ) 875-125 MG tablet; Take 1 tablet by mouth 2 (two) times daily.  Dispense: 20 tablet; Refill: 0 - chlorpheniramine-HYDROcodone (TUSSIONEX) 10-8 MG/5ML; Take 5 mLs by mouth every 12 (twelve) hours as needed for cough (cough, will cause drowsiness.).  Dispense: 115 mL; Refill: 0 - azelastine  (ASTELIN ) 0.1 % nasal spray; Place 2 sprays into both nostrils 2 (two) times daily. Use in each nostril as directed  Dispense: 30 mL; Refill: 11  Patient started taking medications yesterday.  8. NEXT APPOINTMENT: Have you scheduled a follow-up appointment with your doctor?     No. 9. PAIN: Is there any pain? If Yes, ask: How bad is it?  (Scale 0-10; or none, mild, moderate, severe)     Sore throat, 5/10.  10. FEVER: Do you have a fever? If Yes, ask: What is it, how was it measured  and when did it start?  No.  11. OTHER SYMPTOMS: Do you have any other symptoms?       No.  Protocols used: Recent Medical Visit for Illness Follow-up Call-[facility]

## 2024-07-23 NOTE — Telephone Encounter (Signed)
 Steroid (Medrol  dosepack) sent to the pharmacy as requested.
